# Patient Record
Sex: Male | Born: 1970 | Race: White | Hispanic: No | Marital: Married | Smoking: Never smoker
Health system: Southern US, Community
[De-identification: ages and names within clinical notes are randomized; demographics above are authoritative.]

## PROBLEM LIST (undated history)

## (undated) DIAGNOSIS — K219 Gastro-esophageal reflux disease without esophagitis: Secondary | ICD-10-CM

## (undated) DIAGNOSIS — E119 Type 2 diabetes mellitus without complications: Secondary | ICD-10-CM

## (undated) DIAGNOSIS — I10 Essential (primary) hypertension: Secondary | ICD-10-CM

## (undated) HISTORY — PX: APPENDECTOMY: SHX54

---

## 2002-12-12 ENCOUNTER — Encounter: Payer: Self-pay | Admitting: Emergency Medicine

## 2002-12-12 ENCOUNTER — Inpatient Hospital Stay (HOSPITAL_COMMUNITY): Admission: EM | Admit: 2002-12-12 | Discharge: 2002-12-14 | Payer: Self-pay | Admitting: Emergency Medicine

## 2002-12-12 ENCOUNTER — Encounter (INDEPENDENT_AMBULATORY_CARE_PROVIDER_SITE_OTHER): Payer: Self-pay | Admitting: Specialist

## 2003-07-02 ENCOUNTER — Emergency Department (HOSPITAL_COMMUNITY): Admission: AD | Admit: 2003-07-02 | Discharge: 2003-07-02 | Payer: Self-pay | Admitting: Family Medicine

## 2007-04-19 ENCOUNTER — Emergency Department (HOSPITAL_COMMUNITY): Admission: EM | Admit: 2007-04-19 | Discharge: 2007-04-19 | Payer: Self-pay | Admitting: Emergency Medicine

## 2008-09-20 IMAGING — CR DG KNEE COMPLETE 4+V*L*
4 series · 4 of 4 positions shown · non-contrast
Comparison: None.

Exam: Left knee, 4 views.

HISTORY: Knee pain. Status post injury.

[t knee ap left]
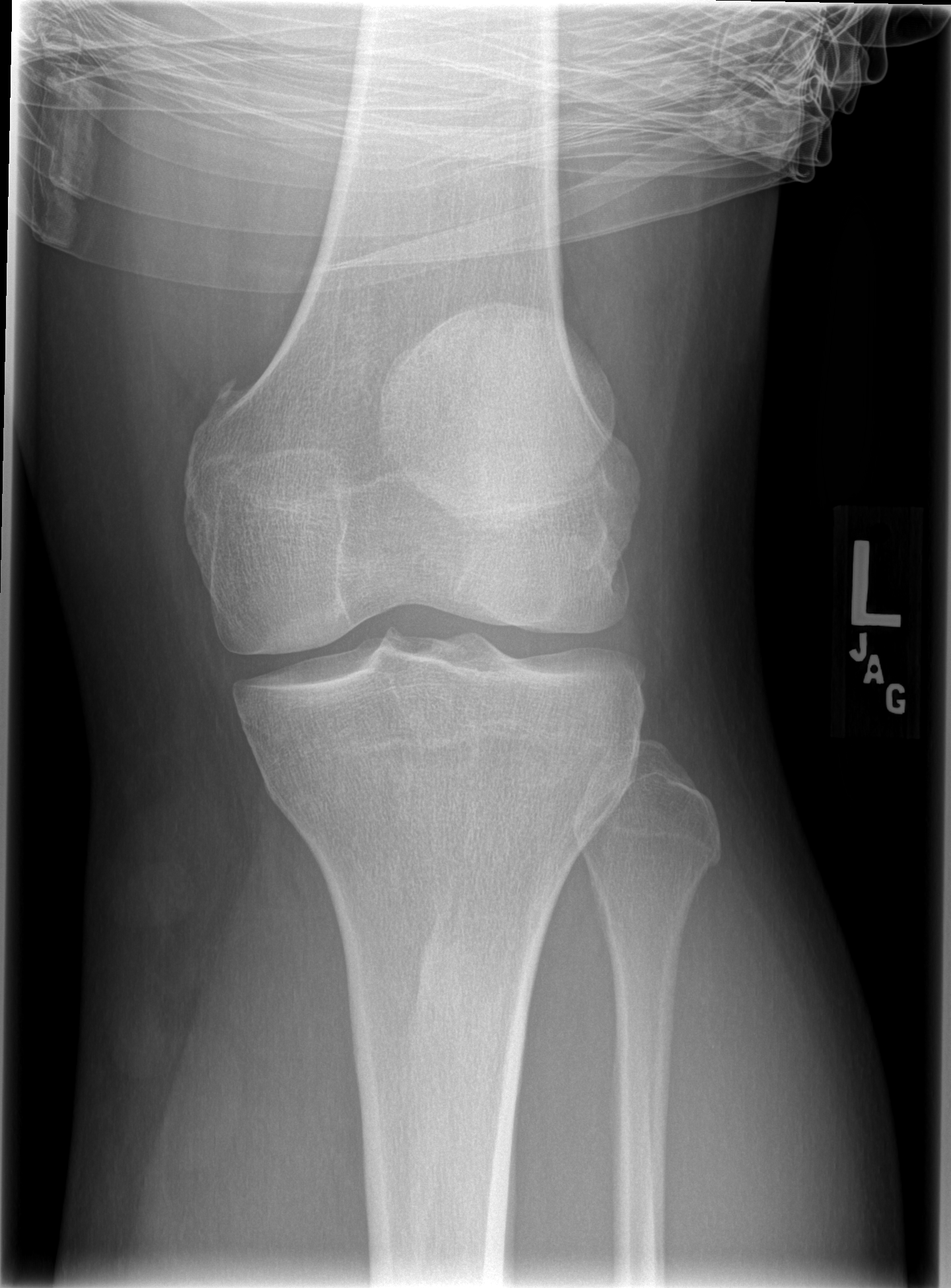

[t knee oblique left (1 of 2)]
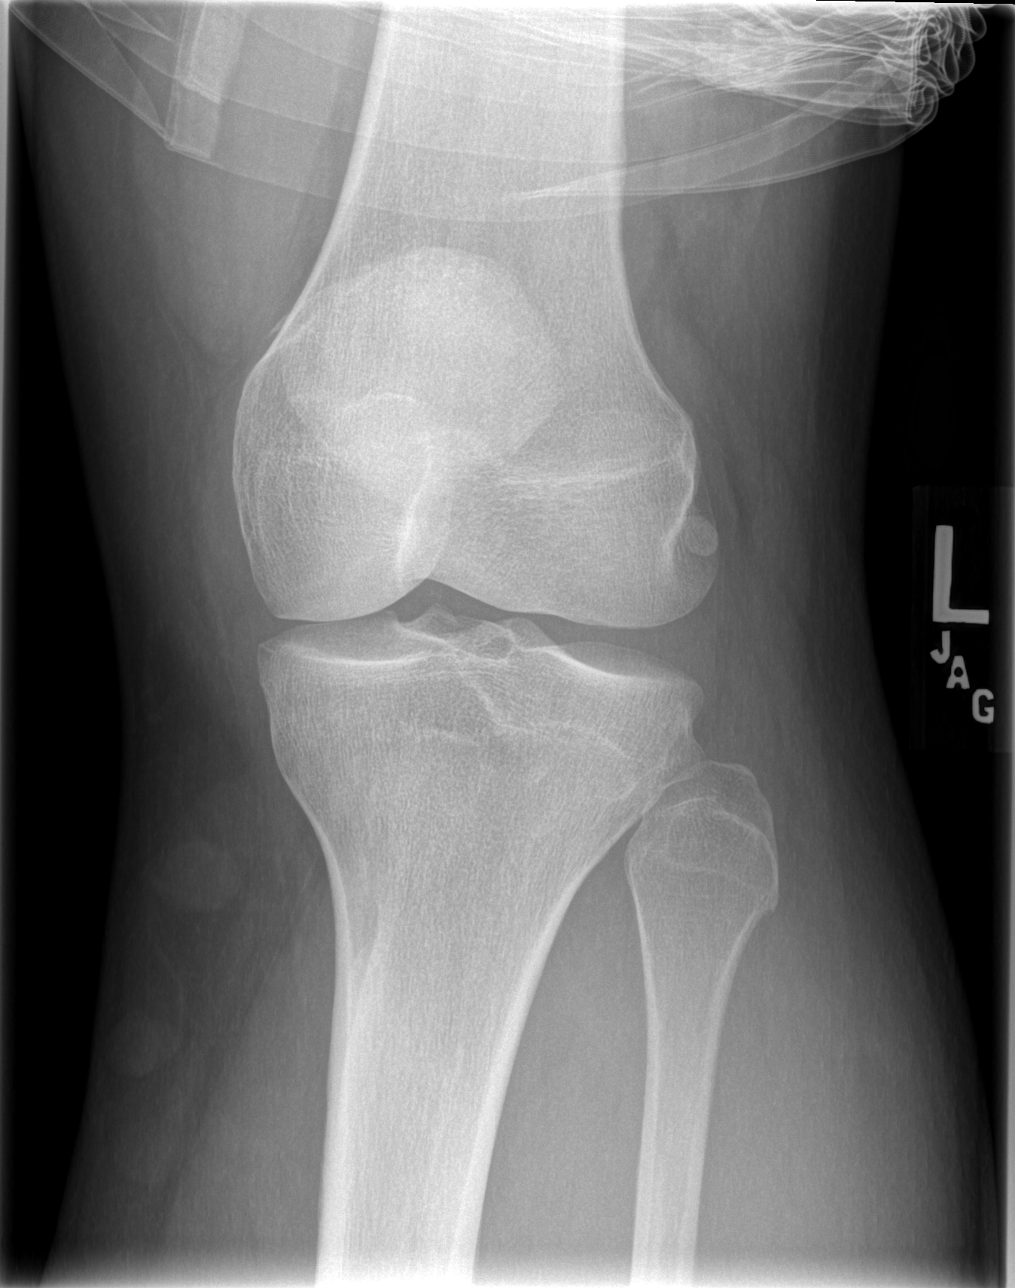

[t knee oblique left (2 of 2)]
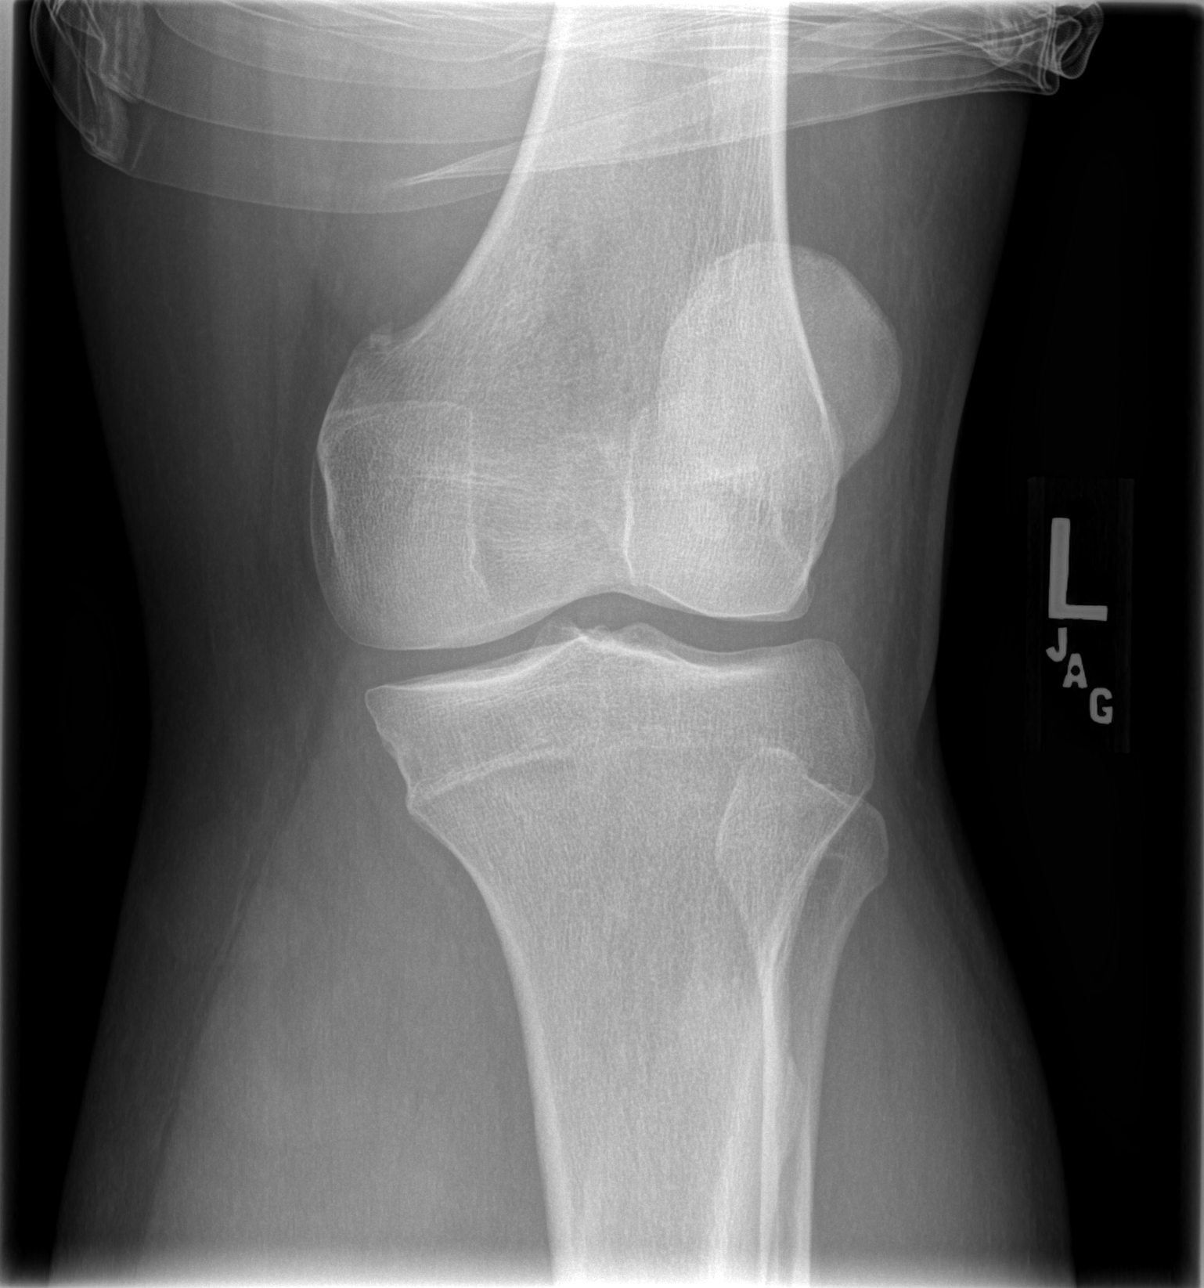

[t knee lat left]
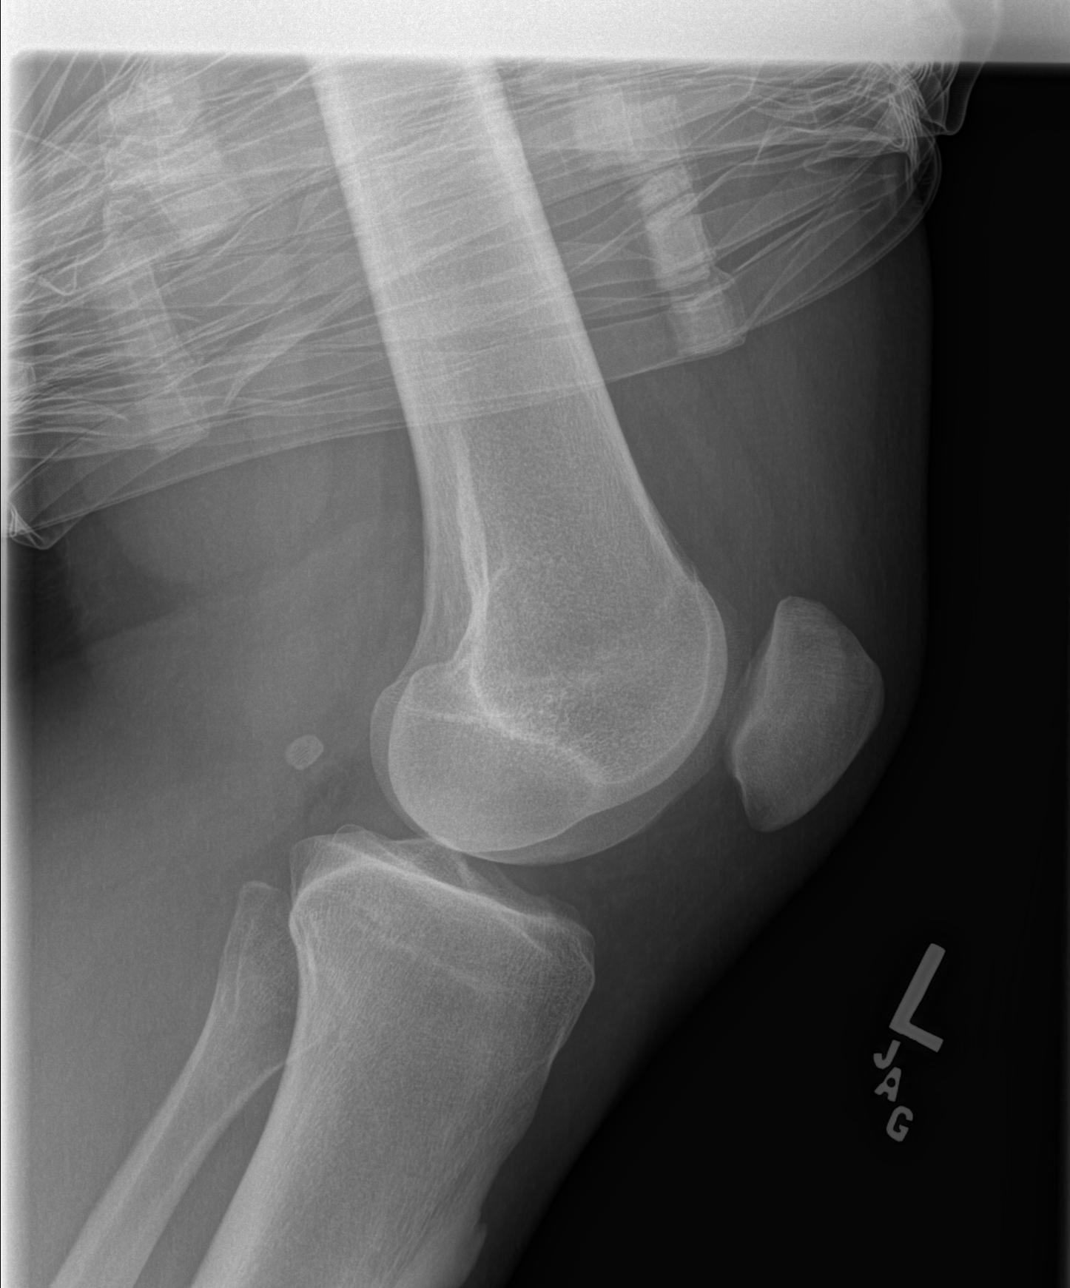

[4 of 4 positions shown; findings below may reference images not displayed]

FINDINGS: There is no joint effusion. 

Quadriceps and patellar tendons are intact.

There is mild degenerative joint disease with osteophyte formation and
sharpening of the tibial spines.
IMPRESSION: 1. Mild degenerative joint disease.
2. No acute osseous abnormalities.

## 2010-12-06 NOTE — Op Note (Signed)
Lawrence Yu, Lawrence Yu NO.:  [id number]   MEDICAL RECORD NO.:  [id number]                   PATIENT TYPE:  INP   LOCATION:  5735                                 FACILITY:  [facility]   PHYSICIAN:  Angelia Mould. Derrell Lolling, M.D.             DATE OF BIRTH:  1971/06/11   DATE OF PROCEDURE:  12/12/2002  DATE OF DISCHARGE:                                 OPERATIVE REPORT   PREOPERATIVE DIAGNOSIS:  Acute appendicitis.   POSTOPERATIVE DIAGNOSIS:  Acute appendicitis.   OPERATION PERFORMED:  Laparoscopic appendectomy.   SURGEON:  Angelia Mould. Derrell Lolling, M.D.   INDICATIONS FOR PROCEDURE:  The patient is a 40 year old white male who  presents to the emergency room with a 20 hour history of right lower  quadrant pain and nausea.  He has not felt that ill but was concerned  because of the persistence of the pain and the persistence of his nausea.  His CBC and urinalysis was normal but on exam, he was found to have some  tenderness in the right lower quadrant lateral to McBurney's point.  CT scan  suggested that he had acute appendicitis.  The appendix was thickened and  there was some inflammatory soft tissue stranding around it and it was noted  to be fairly long and retrocecal going almost all the way up to the liver.  He was brought to the operating room urgently.   OPERATIVE FINDINGS:  The appendix did appear to be inflamed but there was  not any significant exudate.  There was no gangrene. There was no evidence  of perforation.  The liver and gallbladder looked normal.  The right colon  looked normal.  The last three or four feet of terminal ileum looked normal.  There were no other abnormalities.  We felt that appendectomy was indicated.   DESCRIPTION OF PROCEDURE:  Following the induction of general endotracheal  anesthesia, a Foley catheter was inserted.  The patient's abdomen was  prepped and draped in a sterile fashion.  0.5% Marcaine with epinephrine was  used as  a local infiltration anesthetic.  Short transverse incision was made  at the superior rim of the umbilicus.  The fascia was incised to the midline  and the abdominal cavity entered under direct vision.  A 10 mm Hasson trocar  was inserted and secured with a pursestring suture of 0 Vicryl.  The  pneumoperitoneum was created.  The video camera was inserted with  visualization and findings as described above.  We placed a 5 mm trocar in  the right upper quadrant and a 12 mm trocar in the suprapubic area.  We  ultimately had to place a 6 mm trocar in the midline in the infraumbilical  area for retraction.   We surveyed the entire abdomen and found no other abnormality other than a  very long retrocecal appendix.  Using the 30 degree  scope, we were able to  visualize the base of the appendix and to mobilize the proximal appendix  from its lateral peritoneal attachments using the Harmonic scalpel.  We  could not visualize the tip of the appendix at this point and so I created a  window in the mesoappendix and could clearly visualize this window and could  clearly visualize the insertion of the appendix on the cecum.  I stapled  across this using the Endo GIA stapling device which amputated the appendix  from the cecum.  The staple line was quite healthy and secure.  We had a  little bit of bleeding from the mesoappendix which was easily controlled  with the Harmonic scalpel.  We then spent a great deal of time slowly  mobilizing the appendix out of the retroperitoneum,  mobilizing the right  colon medially but ultimately, I was able to take all of the mesoappendix  down and dissect all the way up to the tip of the appendix which was almost  touching the undersurface of the liver.  The appendix was removed in one  piece intact.  It was placed in a specimen bag and removed.   I then irrigated the right pericolic gutter and the subphrenic space with  saline until the irrigation fluid was clear.  I  checked for bleeding and  there was no bleeding anywhere.  I took a careful look at the right colon  and the small bowel, found no evidence of injury or any other disease  process.  The trocars were removed under direct vision.  There was no  bleeding from the trocar sites.  The pneumoperitoneum was released.  The  fascia at the umbilicus and the fascia at the suprapubic trocar site were  closed with 0 Vicryl sutures.  The incisions were irrigated with saline and  skin closed with subcuticular sutures of 4-0 Vicryl and Steri-Strips.  Clean  bandages were placed.  The patient was taken to the recovery room in stable  condition.  The estimated blood loss was about 20mL. Complications were  none.  Sponge, needle and instrument counts were correct.                                                Angelia Mould. Derrell Lolling, M.D.    HMI/MEDQ  D:  12/12/2002  T:  12/13/2002  Job:  [id number]   cc:   L. Lupe Carney, M.D.  [address] [place]  [place] [address]  Fax: [phone]

## 2010-12-06 NOTE — H&P (Signed)
NAMEPARRIS, CUDWORTH NO.:  [id number]   MEDICAL RECORD NO.:  [id number]                   PATIENT TYPE:  INP   LOCATION:  1824                                 FACILITY:  [facility]   PHYSICIAN:  Angelia Mould. Derrell Lolling, M.D.             DATE OF BIRTH:  12-Aug-1970   DATE OF ADMISSION:  12/12/2002  DATE OF DISCHARGE:                                HISTORY & PHYSICAL   CHIEF COMPLAINT:  Right lower quadrant pain and nausea.   HISTORY OF PRESENT ILLNESS:  This is a 40 year old white male in previously  good health.  At 9:30 p.m. last night approximately 24 hours ago he  developed fairly sudden onset of right lower quadrant pain.  It hurt to move  or cough, but he was able to sleep some.  He saw Dr. Lupe Carney this  morning, an urinalysis was normal, and he was thought not to have any acute  surgical problem.  He ate lunch today, then became nauseated, and was  nauseated all afternoon.  He did not vomit.  He was concerned and came to  the emergency room.   He states that he had diarrhea about 1-1/2 weeks ago for several days, but  that has resolved, and he had a normal bowel movement yesterday.  He has not  had a bowel movement today.  He is voiding without any symptoms.  He denies  fevers, chills, or sweats.  He has not had any prior episodes of this  problem.   He had a CT scan in the emergency room tonight and that shows a dilated  appendix at 14 mm in diameter in the retrocecal location, and there is some  definite soft tissue stranding noted.  I was asked to see him because of his  symptoms and abnormal CT scan.   PAST MEDICAL HISTORY:  1. He has passed a kidney stone on the right in the past.  2. Pilonidal abscess drained.  3. Gastroesophageal reflux disease, and he has had esophageal dilatation at     [facility].  4. Elevated triglycerides.   CURRENT MEDICATIONS:  1. Tri-Chlor one tablet daily.  2. Nexium one tablet daily.   ALLERGIES:   No known drug allergies.   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  He is  married, has three children.  Denies the use of tobacco or alcohol.   FAMILY HISTORY:  Mother living and well.  Father living and has had a  myocardial infarction, has diabetes.  One brother living and well.   REVIEW OF SYMPTOMS:  All systems reviewed and are noncontributory except as  described above.   PHYSICAL EXAMINATION:  GENERAL:  A very pleasant, overweight, white male in  minimal distress.  VITAL SIGNS:  Temperature 98.8, pulse 100, respirations 20, blood pressure  121/80.  HEENT:  Eyes:  Sclerae clear.  Extraocular movements were intact.  ENT:  Nose, lips, tongue, and oropharynx without gross lesions.  NECK:  Supple, nontender, no thyroid mass.  No adenopathy.  No jugular  venous distention.  LUNGS:  Clear to auscultation.  No CVA tenderness.  HEART:  Regular rate and rhythm, no murmur.  Heart sounds are in the left  chest.  Radial, femoral, and posterior tibial pulses are palpable, no  peripheral edema.  ABDOMEN:  Somewhat obese.  Soft.  Not distended.  Hypoactive bowel sounds.  He is tender in the right lower quadrant just lateral to McBurney's point,  and this is a very localized reproducible finding.  He is not particularly  tender anywhere else.  There is no mass.  There is no hernia.  Liver and  spleen do not appear enlarged.  He really just has tenderness and minimal  guarding.  There is no rebound tenderness at all.  GENITOURINARY:  Penis, scrotum, and testes are normal.  There is no  tenderness or mass.  EXTREMITIES:  All four extremities move well.  There is no deformity or  pain.  NEUROLOGIC:  No gross motor or sensory deficits.   LABORATORY DATA:  CT scan suggesting early appendicitis as described above.  Hemoglobin 14.9, white blood cell count 6300.  Urinalysis is normal.   ASSESSMENT:  1. Right lower quadrant pain.  Early acute appendicitis is suspected  on the     basis of the history and the CT scan findings, although his physical     examination is somewhat atypical and his history is slightly atypical as     well.  Nevertheless, this seems to be the most likely diagnosis.  2. History of kidney stones with negative CT scan for kidney stones.  3. History of gastroesophageal reflux disease.  4. History of elevated triglycerides.   PLAN:  1. The patient will be taken to the operating room for diagnostic     laparoscopy and laparoscopic appendectomy.  2. I have explained to the patient and his wife the differential diagnoses,     including appendicitis, normal appendix, Meckel's diverticulum, Crohn's     disease, viral syndrome, and other unforeseen diagnoses.  I have     explained to them the rationale for the diagnostic laparoscopy and the     uncertainty of the diagnosis.  I have also explained to them the natural     history of appendicitis with rupture and abscess, and that the CT scan     finding seem valid enough to warrant surgery.   I have discussed the indications and details of surgery with them.  Both the  risks and complications have been outlined, including, but not limited to,  bleeding, infection, conversion to open laparotomy, injury to adjacent  organs, wound problems such as hernia or infection, cardiac, pulmonary, and  thromboembolic problems, and other unforeseen problems.  They seem to  understand these issues well.  At this time, all of their questions are  answered.  They are willing to have this laparoscopy done.                                                Angelia Mould. Derrell Lolling, M.D.    HMI/MEDQ  D:  12/12/2002  T:  12/12/2002  Job:  [id number]   cc:   L. Lupe Carney, M.D.  [address] [place]  [place] [address]  Fax: [phone]

## 2016-05-03 ENCOUNTER — Emergency Department (HOSPITAL_BASED_OUTPATIENT_CLINIC_OR_DEPARTMENT_OTHER)
Admission: EM | Admit: 2016-05-03 | Discharge: 2016-05-03 | Disposition: A | Discharge status: Home / Self Care | Payer: Worker's Compensation | Attending: Emergency Medicine | Admitting: Emergency Medicine

## 2016-05-03 ENCOUNTER — Encounter (HOSPITAL_BASED_OUTPATIENT_CLINIC_OR_DEPARTMENT_OTHER): Payer: Self-pay | Admitting: Emergency Medicine

## 2016-05-03 DIAGNOSIS — Z7984 Long term (current) use of oral hypoglycemic drugs: Secondary | ICD-10-CM | POA: Diagnosis not present

## 2016-05-03 DIAGNOSIS — Z7721 Contact with and (suspected) exposure to potentially hazardous body fluids: Secondary | ICD-10-CM | POA: Diagnosis present

## 2016-05-03 DIAGNOSIS — Z578 Occupational exposure to other risk factors: Secondary | ICD-10-CM | POA: Insufficient documentation

## 2016-05-03 DIAGNOSIS — Z79899 Other long term (current) drug therapy: Secondary | ICD-10-CM | POA: Insufficient documentation

## 2016-05-03 DIAGNOSIS — I10 Essential (primary) hypertension: Secondary | ICD-10-CM | POA: Insufficient documentation

## 2016-05-03 DIAGNOSIS — Z579 Occupational exposure to unspecified risk factor: Secondary | ICD-10-CM

## 2016-05-03 HISTORY — DX: Essential (primary) hypertension: I10
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
## 2016-05-03 NOTE — ED Notes (Signed)
PA at bedside.

## 2016-08-04 ENCOUNTER — Emergency Department (HOSPITAL_BASED_OUTPATIENT_CLINIC_OR_DEPARTMENT_OTHER)
Admission: EM | Admit: 2016-08-04 | Discharge: 2016-08-04 | Disposition: A | Discharge status: Home / Self Care | Payer: Worker's Compensation | Attending: Emergency Medicine | Admitting: Emergency Medicine

## 2016-08-04 ENCOUNTER — Encounter (HOSPITAL_BASED_OUTPATIENT_CLINIC_OR_DEPARTMENT_OTHER): Payer: Self-pay | Admitting: Emergency Medicine

## 2016-08-04 ENCOUNTER — Emergency Department (HOSPITAL_BASED_OUTPATIENT_CLINIC_OR_DEPARTMENT_OTHER): Payer: Worker's Compensation

## 2016-08-04 DIAGNOSIS — Z79899 Other long term (current) drug therapy: Secondary | ICD-10-CM | POA: Insufficient documentation

## 2016-08-04 DIAGNOSIS — Y9389 Activity, other specified: Secondary | ICD-10-CM | POA: Insufficient documentation

## 2016-08-04 DIAGNOSIS — Z7984 Long term (current) use of oral hypoglycemic drugs: Secondary | ICD-10-CM | POA: Diagnosis not present

## 2016-08-04 DIAGNOSIS — Y929 Unspecified place or not applicable: Secondary | ICD-10-CM | POA: Diagnosis not present

## 2016-08-04 DIAGNOSIS — W1839XA Other fall on same level, initial encounter: Secondary | ICD-10-CM | POA: Diagnosis not present

## 2016-08-04 DIAGNOSIS — S8992XA Unspecified injury of left lower leg, initial encounter: Secondary | ICD-10-CM

## 2016-08-04 DIAGNOSIS — Y990 Civilian activity done for income or pay: Secondary | ICD-10-CM | POA: Diagnosis not present

## 2016-08-04 DIAGNOSIS — I10 Essential (primary) hypertension: Secondary | ICD-10-CM | POA: Insufficient documentation

## 2016-08-04 DIAGNOSIS — E119 Type 2 diabetes mellitus without complications: Secondary | ICD-10-CM | POA: Diagnosis not present

## 2016-08-04 HISTORY — DX: Type 2 diabetes mellitus without complications: E11.9

## 2016-08-04 HISTORY — DX: Gastro-esophageal reflux disease without esophagitis: K21.9
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
## 2016-08-04 NOTE — ED Notes (Signed)
Patient returned from XR. 

## 2016-08-04 NOTE — ED Provider Notes (Signed)
[place] [facility] Provider Note   CSN: [id number] Arrival date & time: 08/04/16  1806     History   Chief Complaint Chief Complaint  Patient presents with  . Knee Pain    left    HPI Lawrence Yu is a 46 y.o. male.  The history is provided by the patient.  Knee Pain   This is a new problem. The current episode started 1 to 2 hours ago. The problem occurs constantly. The pain is present in the left knee. The quality of the pain is described as aching. The pain is moderate. Pertinent negatives include no numbness, full range of motion, no stiffness and no tingling. Treatments tried: ice. The treatment provided moderate relief. There has been a history of trauma (mechanical fall with twisting of the knee; her popping int the knee while going down.).    Past Medical History:  Diagnosis Date  . Diabetes mellitus without complication ([place])    pre diabetic  . GERD (gastroesophageal reflux disease)   . Hypertension     There are no active problems to display for this patient.   Past Surgical History:  Procedure Laterality Date  . APPENDECTOMY         Home Medications    Prior to Admission medications   Medication Sig Start Date End Date Taking? Authorizing Provider  metFORMIN (GLUCOPHAGE) 850 MG tablet Take 1,000 mg by mouth 2 (two) times daily with a meal.    Yes Historical Provider, MD  omeprazole (PRILOSEC) 10 MG capsule Take 20 mg by mouth daily.    Yes Historical Provider, MD  LISINOPRIL PO Take by mouth.    Historical Provider, MD    Family History History reviewed. No pertinent family history.  Social History Social History  Substance Use Topics  . Smoking status: Never Smoker  . Smokeless tobacco: Never Used  . Alcohol use No     Allergies   Patient has no known allergies.   Review of Systems Review of Systems  Constitutional: Negative for chills and fever.  HENT: Negative for ear pain and sore throat.   Eyes: Negative for pain and  visual disturbance.  Respiratory: Negative for cough and shortness of breath.   Cardiovascular: Negative for chest pain and palpitations.  Gastrointestinal: Negative for abdominal pain and vomiting.  Genitourinary: Negative for dysuria and hematuria.  Musculoskeletal: Negative for arthralgias, back pain and stiffness.  Skin: Negative for color change and rash.  Neurological: Negative for tingling, seizures, syncope and numbness.  All other systems reviewed and are negative.    Physical Exam Updated Vital Signs BP 138/76 (BP Location: Right Arm)   Pulse 96   Temp 98.1 F (36.7 C) (Oral)   Resp 18   Ht 5\' 11"  (1.803 m)   Wt 280 lb (127 kg)   SpO2 98%   BMI 39.05 kg/m   Physical Exam  Constitutional: He is oriented to person, place, and time. He appears well-developed and well-nourished. No distress.  HENT:  Head: Normocephalic and atraumatic.  Right Ear: External ear normal.  Left Ear: External ear normal.  Nose: Nose normal.  Mouth/Throat: Mucous membranes are normal. No trismus in the jaw.  Eyes: Conjunctivae and EOM are normal. No scleral icterus.  Neck: Normal range of motion and phonation normal.  Cardiovascular: Normal rate and regular rhythm.   Pulmonary/Chest: Effort normal. No stridor. No respiratory distress.  Abdominal: He exhibits no distension.  Musculoskeletal: Normal range of motion. He exhibits no edema.  Left knee: He exhibits normal range of motion, no swelling, no effusion, normal patellar mobility and no bony tenderness. Tenderness found. Medial joint line and MCL tenderness noted. No lateral joint line, no LCL and no patellar tendon tenderness noted.  Neurological: He is alert and oriented to person, place, and time.  Skin: He is not diaphoretic.  Psychiatric: He has a normal mood and affect. His behavior is normal.  Vitals reviewed.    ED Treatments / Results  Labs (all labs ordered are listed, but only abnormal results are displayed) Labs  Reviewed - No data to display  EKG  EKG Interpretation None       Radiology Dg Knee Complete 4 Views Left  Result Date: 08/04/2016 CLINICAL DATA:  Pain and swelling along the medial left knee. EXAM: LEFT KNEE - COMPLETE 4+ VIEW COMPARISON:  Two MRI from 10/15/2007, 04/19/2007 radiographs of the left knee. 2 G FINDINGS: Tubular soft tissue densities are again seen along the medial aspect of the calf consistent with varicose veins. Joint spaces are maintained. No acute fracture or malalignment. Ossific density adjacent to the supracondylar aspect of the medial femoral condyle was present in 2008 and may reflect an old remote MCL injury. IMPRESSION: No acute osseous abnormality noted radiographically. 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 Soft tissue ossification along the course of the proximal MCL may reflect an old remote injury is again noted. Electronically Signed   By: Ashley Royalty M.D.   On: 08/04/2016 18:48    Procedures Procedures (including critical care time)  Medications Ordered in ED Medications - No data to display   Initial Impression / Assessment and Plan / ED Course  I have reviewed the triage vital signs and the nursing notes.  Pertinent labs & imaging results that were available during my care of the patient were reviewed by me and considered in my medical decision making (see chart for details).  Clinical Course     Likely MCL vs meniscal tear. No acute bony injuries on plain film. Ortho follow up. Knee immobilizer provider.  The patient is safe for discharge with strict return precautions.    Final Clinical Impressions(s) / ED Diagnoses   Final diagnoses:  Injury of left knee, initial encounter   Disposition: Discharge  Condition: Good  I have discussed the results, Dx and Tx plan with the patient who expressed understanding and agree(s) with the plan. Discharge instructions discussed at great length. The patient was given strict return precautions who  verbalized understanding of the instructions. No further questions at time of discharge.    New Prescriptions   No medications on file    Follow Up: Orthopedic surgery  Schedule an appointment as soon as possible for a visit in 2 weeks If symptoms do not improve or  worsen      Fatima Blank, MD 08/04/16 [id number]

## 2016-08-04 NOTE — ED Notes (Signed)
Patient transported to X-ray 

## 2016-08-04 NOTE — ED Notes (Signed)
Signature pad not working for pt to sign out.  Pt verbalized understanding.

## 2017-05-06 DIAGNOSIS — E1169 Type 2 diabetes mellitus with other specified complication: Secondary | ICD-10-CM | POA: Insufficient documentation

## 2017-05-06 DIAGNOSIS — E782 Mixed hyperlipidemia: Secondary | ICD-10-CM

## 2017-05-06 DIAGNOSIS — E1129 Type 2 diabetes mellitus with other diabetic kidney complication: Secondary | ICD-10-CM | POA: Insufficient documentation

## 2017-05-06 DIAGNOSIS — E1165 Type 2 diabetes mellitus with hyperglycemia: Secondary | ICD-10-CM | POA: Insufficient documentation

## 2017-05-26 DIAGNOSIS — G4733 Obstructive sleep apnea (adult) (pediatric): Secondary | ICD-10-CM | POA: Insufficient documentation

## 2017-05-26 DIAGNOSIS — E669 Obesity, unspecified: Secondary | ICD-10-CM | POA: Insufficient documentation

## 2017-05-26 DIAGNOSIS — G4730 Sleep apnea, unspecified: Secondary | ICD-10-CM | POA: Insufficient documentation

## 2017-05-26 DIAGNOSIS — I10 Essential (primary) hypertension: Secondary | ICD-10-CM | POA: Insufficient documentation

## 2017-05-26 DIAGNOSIS — Z8249 Family history of ischemic heart disease and other diseases of the circulatory system: Secondary | ICD-10-CM | POA: Insufficient documentation

## 2017-09-03 DIAGNOSIS — E781 Pure hyperglyceridemia: Secondary | ICD-10-CM | POA: Insufficient documentation

## 2017-09-08 ENCOUNTER — Ambulatory Visit: Payer: Self-pay | Admitting: Nurse Practitioner

## 2017-10-08 ENCOUNTER — Other Ambulatory Visit: Payer: Self-pay

## 2017-10-08 MED ORDER — OMEPRAZOLE 10 MG PO CPDR: 20.0000 mg | DELAYED_RELEASE_CAPSULE | Freq: Every day | ORAL | 5 refills | Status: DC

## 2017-10-27 ENCOUNTER — Other Ambulatory Visit: Payer: Self-pay

## 2017-10-27 MED ORDER — OMEPRAZOLE 20 MG PO CPDR: 20.0000 mg | DELAYED_RELEASE_CAPSULE | Freq: Every day | ORAL | 5 refills | Status: DC

## 2017-11-11 ENCOUNTER — Other Ambulatory Visit: Payer: Self-pay | Admitting: Nurse Practitioner

## 2017-11-11 MED ORDER — LISINOPRIL 5 MG PO TABS: 5.0000 mg | ORAL_TABLET | Freq: Every day | ORAL | 2 refills | Status: DC

## 2017-12-16 DIAGNOSIS — E66812 Obesity, class 2: Secondary | ICD-10-CM | POA: Insufficient documentation

## 2018-06-14 ENCOUNTER — Other Ambulatory Visit: Payer: Self-pay

## 2018-06-14 MED ORDER — OMEPRAZOLE 20 MG PO CPDR: 20.0000 mg | DELAYED_RELEASE_CAPSULE | Freq: Every day | ORAL | 5 refills | Status: DC

## 2018-06-21 ENCOUNTER — Other Ambulatory Visit: Payer: Self-pay

## 2018-06-21 ENCOUNTER — Telehealth: Payer: Self-pay

## 2018-06-21 NOTE — Telephone Encounter (Signed)
[facility] PT NEED APPT FOR REFILLS LAST HERE 10/18

## 2018-08-31 ENCOUNTER — Ambulatory Visit: Payer: Self-pay | Admitting: Adult Health

## 2018-09-02 ENCOUNTER — Encounter: Payer: Self-pay | Admitting: Adult Health

## 2018-09-02 ENCOUNTER — Ambulatory Visit: Payer: PRIVATE HEALTH INSURANCE | Admitting: Adult Health

## 2018-09-02 VITALS — BP 140/82 | HR 107 | Resp 16 | Ht 71.0 in | Wt 278.0 lb

## 2018-09-02 DIAGNOSIS — G4733 Obstructive sleep apnea (adult) (pediatric): Secondary | ICD-10-CM | POA: Diagnosis not present

## 2018-09-02 DIAGNOSIS — N529 Male erectile dysfunction, unspecified: Secondary | ICD-10-CM

## 2018-09-02 DIAGNOSIS — E1169 Type 2 diabetes mellitus with other specified complication: Secondary | ICD-10-CM | POA: Diagnosis not present

## 2018-09-02 DIAGNOSIS — Z9989 Dependence on other enabling machines and devices: Secondary | ICD-10-CM

## 2018-09-02 DIAGNOSIS — E1165 Type 2 diabetes mellitus with hyperglycemia: Secondary | ICD-10-CM

## 2018-09-02 DIAGNOSIS — I10 Essential (primary) hypertension: Secondary | ICD-10-CM

## 2018-09-02 DIAGNOSIS — E782 Mixed hyperlipidemia: Secondary | ICD-10-CM

## 2018-09-02 LAB — POCT GLYCOSYLATED HEMOGLOBIN (HGB A1C): Hemoglobin A1C: 7.3 % — AB (ref 4.0–5.6)

## 2018-09-02 MED ORDER — OMEPRAZOLE 20 MG PO CPDR: 20.0000 mg | DELAYED_RELEASE_CAPSULE | Freq: Every day | ORAL | 5 refills | Status: DC

## 2018-09-02 MED ORDER — LISINOPRIL 5 MG PO TABS: 5.0000 mg | ORAL_TABLET | Freq: Every day | ORAL | 2 refills | Status: DC

## 2018-09-02 MED ORDER — SILDENAFIL CITRATE 20 MG PO TABS
20.0000 mg | ORAL_TABLET | Freq: Every day | ORAL | 3 refills | Status: DC | PRN
Start: 2018-09-02 — End: 2021-06-27

## 2018-09-02 NOTE — Progress Notes (Addendum)
[facility], [place] [address]  Internal MEDICINE  Office Visit Note  Patient Name: Lawrence Yu  [id number]  [id number]  Date of Service: 09/02/2018  No chief complaint on file.   HPI  Pt here for follow up on DM, HTN and ED.  Pt denies any current issues.  His blood pressures currently well controlled on lisinopril.  His A1c today is 7.3.  Encourage patient to continue modifying his diet and taking medications as prescribed.  Denies any neuropathy or other pains at this time.  Patient reports his erectile dysfunction remains unchanged.  He gets good results taking 40 mg of sildenafil for sexual activity.  He is requesting some med refills which will be provided at this visit.   Current Medication: Outpatient Encounter Medications as of 09/02/2018  Medication Sig  . Canagliflozin-metFORMIN HCl (INVOKAMET) 50-1000 MG TABS Take by mouth 2 (two) times daily.  . Choline Fenofibrate (FENOFIBRIC ACID) 135 MG CPDR Take by mouth.  Marland Kitchen glucose blood (ONETOUCH VERIO) test strip Use to check blood sugar 2 time(s) daily..  . Icosapent Ethyl (VASCEPA) 1 g CAPS Take by mouth daily.  Marland Kitchen LIFESCAN FINEPOINT LANCETS MISC Use to check blood sugar 2 time(s) daily.  Marland Kitchen lisinopril (PRINIVIL,ZESTRIL) 5 MG tablet Take 1 tablet (5 mg total) by mouth daily.  Marland Kitchen omeprazole (PRILOSEC) 20 MG capsule Take 1 capsule (20 mg total) by mouth daily.  . sildenafil (REVATIO) 20 MG tablet Take 1 tablet (20 mg total) by mouth daily as needed. Take 2 tab daily as needed  . [DISCONTINUED] lisinopril (PRINIVIL,ZESTRIL) 5 MG tablet Take 1 tablet (5 mg total) by mouth daily.  . [DISCONTINUED] LISINOPRIL PO Take by mouth.  . [DISCONTINUED] omeprazole (PRILOSEC) 20 MG capsule Take 1 capsule (20 mg total) by mouth daily.  . [DISCONTINUED] sildenafil (REVATIO) 20 MG tablet Take 20 mg by mouth. Take 2 tab daily as needed  . [DISCONTINUED] metFORMIN (GLUCOPHAGE) 850 MG tablet Take 1,000 mg by mouth 2  (two) times daily with a meal.    No facility-administered encounter medications on file as of 09/02/2018.     Surgical History: Past Surgical History:  Procedure Laterality Date  . APPENDECTOMY      Medical History: Past Medical History:  Diagnosis Date  . Diabetes mellitus without complication ([place])    pre diabetic  . GERD (gastroesophageal reflux disease)   . Hypertension     Family History: History reviewed. No pertinent family history.  Social History   Socioeconomic History  . Marital status: Married    Spouse name: Not on file  . Number of children: Not on file  . Years of education: Not on file  . Highest education level: Not on file  Occupational History  . Not on file  Social Needs  . Financial resource strain: Not on file  . Food insecurity:    Worry: Not on file    Inability: Not on file  . Transportation needs:    Medical: Not on file    Non-medical: Not on file  Tobacco Use  . Smoking status: Never Smoker  . Smokeless tobacco: Never Used  Substance and Sexual Activity  . Alcohol use: No  . Drug use: No  . Sexual activity: Not on file  Lifestyle  . Physical activity:    Days per week: Not on file    Minutes per session: Not on file  . Stress: Not on file  Relationships  . Social connections:  Talks on phone: Not on file    Gets together: Not on file    Attends religious service: Not on file    Active member of club or organization: Not on file    Attends meetings of clubs or organizations: Not on file    Relationship status: Not on file  . Intimate partner violence:    Fear of current or ex partner: Not on file    Emotionally abused: Not on file    Physically abused: Not on file    Forced sexual activity: Not on file  Other Topics Concern  . Not on file  Social History Narrative  . Not on file      Review of Systems  Constitutional: Negative.  Negative for chills, fatigue and unexpected weight change.  HENT: Negative.  Negative  for congestion, rhinorrhea, sneezing and sore throat.   Eyes: Negative for redness.  Respiratory: Negative.  Negative for cough, chest tightness and shortness of breath.   Cardiovascular: Negative.  Negative for chest pain and palpitations.  Gastrointestinal: Negative.  Negative for abdominal pain, constipation, diarrhea, nausea and vomiting.  Endocrine: Negative.   Genitourinary: Negative.  Negative for dysuria and frequency.  Musculoskeletal: Negative.  Negative for arthralgias, back pain, joint swelling and neck pain.  Skin: Negative.  Negative for rash.  Allergic/Immunologic: Negative.   Neurological: Negative.  Negative for tremors and numbness.  Hematological: Negative for adenopathy. Does not bruise/bleed easily.  Psychiatric/Behavioral: Negative.  Negative for behavioral problems, sleep disturbance and suicidal ideas. The patient is not nervous/anxious.     Vital Signs: BP 140/82   Pulse (!) 107   Resp 16   Ht 5\' 11"  (1.803 m)   Wt 278 lb (126.1 kg)   SpO2 98%   BMI 38.77 kg/m    Physical Exam Vitals signs and nursing note reviewed.  Constitutional:      General: He is not in acute distress.    Appearance: He is well-developed. He is not diaphoretic.  HENT:     Head: Normocephalic and atraumatic.     Mouth/Throat:     Pharynx: No oropharyngeal exudate.  Eyes:     Pupils: Pupils are equal, round, and reactive to light.  Neck:     Musculoskeletal: Normal range of motion and neck supple.     Thyroid: No thyromegaly.     Vascular: No JVD.     Trachea: No tracheal deviation.  Cardiovascular:     Rate and Rhythm: Normal rate and regular rhythm.     Heart sounds: Normal heart sounds. No murmur. No friction rub. No gallop.   Pulmonary:     Effort: Pulmonary effort is normal. No respiratory distress.     Breath sounds: Normal breath sounds. No wheezing or rales.  Chest:     Chest wall: No tenderness.  Abdominal:     Palpations: Abdomen is soft.     Tenderness: There  is no abdominal tenderness. There is no guarding.  Musculoskeletal: Normal range of motion.  Lymphadenopathy:     Cervical: No cervical adenopathy.  Skin:    General: Skin is warm and dry.  Neurological:     Mental Status: He is alert and oriented to person, place, and time.     Cranial Nerves: No cranial nerve deficit.  Psychiatric:        Behavior: Behavior normal.        Thought Content: Thought content normal.        Judgment: Judgment normal.  Assessment/Plan: 1. Uncontrolled type 2 diabetes mellitus with hyperglycemia (HCC) A1c 7.3.  No medication change at this time but encouraged patient to continue taking medications and follow-up periodically for A1c recheck. - POCT HgB A1C  2. Erectile dysfunction, unspecified erectile dysfunction type Refill patient's prescription for sildenafil.  Using good Rx card and sent to [facility] pharmacy.  Instructed patient to continue taking medication as before and to alert [place] of any changes. - sildenafil (REVATIO) 20 MG tablet; Take 1 tablet (20 mg total) by mouth daily as needed. Take 2 tab daily as needed  Dispense: 30 tablet; Refill: 3  3. OSA on CPAP Patient reports his CPAP machine has broken after many years and he needs a new CPAP sheen.  Home sleep study ordered at this time with a follow-up after home sleep study. - Home sleep test  4. 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  5. Hypertension, unspecified type Patient's blood pressure stable today 140/82.  He continues to take his medications as prescribed.  It is of note that the patient was tachycardic initially at 107 bpm.  Upon re-checking in the exam room found patient's pulse to be 88.  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  We will continue to follow patient's pulse and blood pressure at future visits.  No dizziness, chest pain, shortness breath or other issues.  General Counseling: Lawrence Yu verbalizes understanding of the findings of todays visit and agrees with plan of treatment. I have discussed any further diagnostic evaluation that may be needed or ordered today. We also reviewed his medications today. he has been encouraged to call the office with any questions or concerns that should arise related to todays visit.    Orders Placed This Encounter  Procedures  . POCT HgB A1C  . Home sleep test    Meds ordered this encounter  Medications  . omeprazole (PRILOSEC) 20 MG capsule    Sig: Take 1 capsule (20 mg total) by mouth daily.    Dispense:  30 capsule    Refill:  5  . lisinopril (PRINIVIL,ZESTRIL) 5 MG tablet    Sig: Take 1 tablet (5 mg total) by mouth daily.    Dispense:  90 tablet    Refill:  2  . sildenafil (REVATIO) 20 MG tablet    Sig: Take 1 tablet (20 mg total) by mouth daily as needed. Take 2 tab daily as needed    Dispense:  30 tablet    Refill:  3    Time spent: 25 Minutes   This patient was seen by Orson Gear AGNP-C in Collaboration with Dr Lavera Guise as a part of collaborative care agreement     Kendell Bane AGNP-C Internal medicine

## 2018-09-02 NOTE — Patient Instructions (Signed)
Diabetes Mellitus and Nutrition, Adult  When you have diabetes (diabetes mellitus), it is very important to have healthy eating habits because your blood sugar (glucose) levels are greatly affected by what you eat and drink. Eating healthy foods in the appropriate amounts, at about the same times every day, can help you:  · Control your blood glucose.  · Lower your risk of heart disease.  · Improve your blood pressure.  · Reach or maintain a healthy weight.  Every person with diabetes is different, and each person has different needs for a meal plan. Your health care provider may recommend that you work with a diet and nutrition specialist (dietitian) to make a meal plan that is best for you. Your meal plan may vary depending on factors such as:  · The calories you need.  · The medicines you take.  · Your weight.  · Your blood glucose, blood pressure, and cholesterol levels.  · Your activity level.  · Other health conditions you have, such as heart or kidney disease.  How do carbohydrates affect me?  Carbohydrates, also called carbs, affect your blood glucose level more than any other type of food. Eating carbs naturally raises the amount of glucose in your blood. Carb counting is a method for keeping track of how many carbs you eat. Counting carbs is important to keep your blood glucose at a healthy level, especially if you use insulin or take certain oral diabetes medicines.  It is important to know how many carbs you can safely have in each meal. This is different for every person. Your dietitian can help you calculate how many carbs you should have at each meal and for each snack.  Foods that contain carbs include:  · Bread, cereal, rice, pasta, and crackers.  · Potatoes and corn.  · Peas, beans, and lentils.  · Milk and yogurt.  · Fruit and juice.  · Desserts, such as cakes, cookies, ice cream, and candy.  How does alcohol affect me?  Alcohol can cause a sudden decrease in blood glucose (hypoglycemia),  especially if you use insulin or take certain oral diabetes medicines. Hypoglycemia can be a life-threatening condition. Symptoms of hypoglycemia (sleepiness, dizziness, and confusion) are similar to symptoms of having too much alcohol.  If your health care provider says that alcohol is safe for you, follow these guidelines:  · Limit alcohol intake to no more than 1 drink per day for nonpregnant women and 2 drinks per day for men. One drink equals 12 oz of beer, 5 oz of wine, or 1½ oz of hard liquor.  · Do not drink on an empty stomach.  · Keep yourself hydrated with water, diet soda, or unsweetened iced tea.  · Keep in mind that regular soda, juice, and other mixers may contain a lot of sugar and must be counted as carbs.  What are tips for following this plan?    Reading food labels  · Start by checking the serving size on the "Nutrition Facts" label of packaged foods and drinks. The amount of calories, carbs, fats, and other nutrients listed on the label is based on one serving of the item. Many items contain more than one serving per package.  · Check the total grams (g) of carbs in one serving. You can calculate the number of servings of carbs in one serving by dividing the total carbs by 15. For example, if a food has 30 g of total carbs, it would be equal to 2   servings of carbs.  · Check the number of grams (g) of saturated and trans fats in one serving. Choose foods that have low or no amount of these fats.  · Check the number of milligrams (mg) of salt (sodium) in one serving. Most people should limit total sodium intake to less than 2,300 mg per day.  · Always check the nutrition information of foods labeled as "low-fat" or "nonfat". These foods may be higher in added sugar or refined carbs and should be avoided.  · Talk to your dietitian to identify your daily goals for nutrients listed on the label.  Shopping  · Avoid buying canned, premade, or processed foods. These foods tend to be high in fat, sodium,  and added sugar.  · Shop around the outside edge of the grocery store. This includes fresh fruits and vegetables, bulk grains, fresh meats, and fresh dairy.  Cooking  · Use low-heat cooking methods, such as baking, instead of high-heat cooking methods like deep frying.  · Cook using healthy oils, such as olive, canola, or sunflower oil.  · Avoid cooking with butter, cream, or high-fat meats.  Meal planning  · Eat meals and snacks regularly, preferably at the same times every day. Avoid going long periods of time without eating.  · Eat foods high in fiber, such as fresh fruits, vegetables, beans, and whole grains. Talk to your dietitian about how many servings of carbs you can eat at each meal.  · Eat 4-6 ounces (oz) of lean protein each day, such as lean meat, chicken, fish, eggs, or tofu. One oz of lean protein is equal to:  ? 1 oz of meat, chicken, or fish.  ? 1 egg.  ? ¼ cup of tofu.  · Eat some foods each day that contain healthy fats, such as avocado, nuts, seeds, and fish.  Lifestyle  · Check your blood glucose regularly.  · Exercise regularly as told by your health care provider. This may include:  ? 150 minutes of moderate-intensity or vigorous-intensity exercise each week. This could be brisk walking, biking, or water aerobics.  ? Stretching and doing strength exercises, such as yoga or weightlifting, at least 2 times a week.  · Take medicines as told by your health care provider.  · Do not use any products that contain nicotine or tobacco, such as cigarettes and e-cigarettes. If you need help quitting, ask your health care provider.  · Work with a counselor or diabetes educator to identify strategies to manage stress and any emotional and social challenges.  Questions to ask a health care provider  · Do I need to meet with a diabetes educator?  · Do I need to meet with a dietitian?  · What number can I call if I have questions?  · When are the best times to check my blood glucose?  Where to find more  information:  · American Diabetes Association: diabetes.org  · Academy of Nutrition and Dietetics: www.eatright.org  · National Institute of Diabetes and Digestive and Kidney Diseases (NIH): www.niddk.nih.gov  Summary  · A healthy meal plan will help you control your blood glucose and maintain a healthy lifestyle.  · Working with a diet and nutrition specialist (dietitian) can help you make a meal plan that is best for you.  · Keep in mind that carbohydrates (carbs) and alcohol have immediate effects on your blood glucose levels. It is important to count carbs and to use alcohol carefully.  This information is not intended to   replace advice given to you by your health care provider. Make sure you discuss any questions you have with your health care provider.  Document Released: 04/03/2005 Document Revised: 02/04/2017 Document Reviewed: 08/11/2016  Elsevier Interactive Patient Education © 2019 Elsevier Inc.

## 2018-09-13 ENCOUNTER — Other Ambulatory Visit (INDEPENDENT_AMBULATORY_CARE_PROVIDER_SITE_OTHER): Payer: PRIVATE HEALTH INSURANCE | Admitting: Internal Medicine

## 2018-09-13 DIAGNOSIS — G4733 Obstructive sleep apnea (adult) (pediatric): Secondary | ICD-10-CM

## 2018-09-13 NOTE — Progress Notes (Signed)
Patient picked up home sleep study , instructions verbal and written was given patient to return tomorrow.

## 2018-09-16 ENCOUNTER — Ambulatory Visit: Payer: PRIVATE HEALTH INSURANCE | Admitting: Internal Medicine

## 2018-09-16 ENCOUNTER — Encounter: Payer: Self-pay | Admitting: Internal Medicine

## 2018-09-16 VITALS — BP 120/84 | HR 95 | Resp 16 | Ht 71.0 in | Wt 279.0 lb

## 2018-09-16 DIAGNOSIS — Z9989 Dependence on other enabling machines and devices: Secondary | ICD-10-CM | POA: Diagnosis not present

## 2018-09-16 DIAGNOSIS — I10 Essential (primary) hypertension: Secondary | ICD-10-CM

## 2018-09-16 DIAGNOSIS — G4733 Obstructive sleep apnea (adult) (pediatric): Secondary | ICD-10-CM | POA: Diagnosis not present

## 2018-09-16 DIAGNOSIS — E1165 Type 2 diabetes mellitus with hyperglycemia: Secondary | ICD-10-CM | POA: Diagnosis not present

## 2018-09-16 NOTE — Patient Instructions (Signed)

## 2018-09-16 NOTE — Progress Notes (Signed)
[facility] [place], [place] [address]  Pulmonary Sleep Medicine   Office Visit Note  Patient Name: Lawrence Yu DOB: [birth year] MRN [id number]  Date of Service: 09/16/2018  Complaints/HPI: Pt is here for follow up on home sleep study.  He had 17 Obstructive apneas and 74 hypopneas during the study.  His total AHI was 13.2.  He will come in for a titration study, so we can get him a new machine at an appropriate pressure.   ROS  General: (-) fever, (-) chills, (-) night sweats, (-) weakness Skin: (-) rashes, (-) itching,. Eyes: (-) visual changes, (-) redness, (-) itching. Nose and Sinuses: (-) nasal stuffiness or itchiness, (-) postnasal drip, (-) nosebleeds, (-) sinus trouble. Mouth and Throat: (-) sore throat, (-) hoarseness. Neck: (-) swollen glands, (-) enlarged thyroid, (-) neck pain. Respiratory: - cough, (-) bloody sputum, - shortness of breath, - wheezing. Cardiovascular: - ankle swelling, (-) chest pain. Lymphatic: (-) lymph node enlargement. Neurologic: (-) numbness, (-) tingling. Psychiatric: (-) anxiety, (-) depression   Current Medication: Outpatient Encounter Medications as of 09/16/2018  Medication Sig  . Canagliflozin-metFORMIN HCl (INVOKAMET) 50-1000 MG TABS Take by mouth 2 (two) times daily.  . Choline Fenofibrate (FENOFIBRIC ACID) 135 MG CPDR Take by mouth.  Vanessa Kick Ethyl (VASCEPA) 1 g CAPS Take by mouth daily.  Marland Kitchen LIFESCAN FINEPOINT LANCETS MISC Use to check blood sugar 2 time(s) daily.  Marland Kitchen lisinopril (PRINIVIL,ZESTRIL) 5 MG tablet Take 1 tablet (5 mg total) by mouth daily.  Marland Kitchen omeprazole (PRILOSEC) 20 MG capsule Take 1 capsule (20 mg total) by mouth daily.  . sildenafil (REVATIO) 20 MG tablet Take 1 tablet (20 mg total) by mouth daily as needed. Take 2 tab daily as needed   No facility-administered encounter medications on file as of 09/16/2018.     Surgical History: Past Surgical History:  Procedure Laterality Date  .  APPENDECTOMY      Medical History: Past Medical History:  Diagnosis Date  . Diabetes mellitus without complication ([place])    pre diabetic  . GERD (gastroesophageal reflux disease)   . Hypertension     Family History: History reviewed. No pertinent family history.  Social History: Social History   Socioeconomic History  . Marital status: Married    Spouse name: Not on file  . Number of children: Not on file  . Years of education: Not on file  . Highest education level: Not on file  Occupational History  . Not on file  Social Needs  . Financial resource strain: Not on file  . Food insecurity:    Worry: Not on file    Inability: Not on file  . Transportation needs:    Medical: Not on file    Non-medical: Not on file  Tobacco Use  . Smoking status: Never Smoker  . Smokeless tobacco: Never Used  Substance and Sexual Activity  . Alcohol use: No  . Drug use: No  . Sexual activity: Not on file  Lifestyle  . Physical activity:    Days per week: Not on file    Minutes per session: Not on file  . Stress: Not on file  Relationships  . Social connections:    Talks on phone: Not on file    Gets together: Not on file    Attends religious service: Not on file    Active member of club or organization: Not on file    Attends meetings of clubs or organizations: Not on  file    Relationship status: Not on file  . Intimate partner violence:    Fear of current or ex partner: Not on file    Emotionally abused: Not on file    Physically abused: Not on file    Forced sexual activity: Not on file  Other Topics Concern  . Not on file  Social History Narrative  . Not on file    Vital Signs: Blood pressure 120/84, pulse 95, resp. rate 16, height 5\' 11"  (1.803 m), weight 279 lb (126.6 kg), SpO2 98 %.  Examination: General Appearance: The patient is well-developed, well-nourished, and in no distress. Skin: Gross inspection of skin unremarkable. Head: normocephalic, no gross  deformities. Eyes: no gross deformities noted. ENT: ears appear grossly normal no exudates. Neck: Supple. No thyromegaly. No LAD. Respiratory: clear bilaterally.. Cardiovascular: Normal S1 and S2 without murmur or rub. Extremities: No cyanosis. pulses are equal. Neurologic: Alert and oriented. No involuntary movements.  LABS: Recent Results (from the past 2160 hour(s))  POCT HgB A1C     Status: Abnormal   Collection Time: 09/02/18  4:21 PM  Result Value Ref Range   Hemoglobin A1C 7.3 (A) 4.0 - 5.6 %   HbA1c POC (<> result, manual entry)     HbA1c, POC (prediabetic range)     HbA1c, POC (controlled diabetic range)      Radiology: Dg Knee Complete 4 Views Left  Result Date: 08/04/2016 CLINICAL DATA:  Pain and swelling along the medial left knee. EXAM: LEFT KNEE - COMPLETE 4+ VIEW COMPARISON:  Two MRI from 10/15/2007, 04/19/2007 radiographs of the left knee. 2 G FINDINGS: Tubular soft tissue densities are again seen along the medial aspect of the calf consistent with varicose veins. Joint spaces are maintained. No acute fracture or malalignment. Ossific density adjacent to the supracondylar aspect of the medial femoral condyle was present in 2008 and may reflect an old remote MCL injury. IMPRESSION: No acute osseous abnormality noted radiographically. 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 Soft tissue ossification along the course of the proximal MCL may reflect an old remote injury is again noted. Electronically Signed   By: Ashley Royalty M.D.   On: 08/04/2016 18:48    No results found.  No results found.    Assessment and Plan: Patient Active Problem List   Diagnosis Date Noted  . Hypertension 05/26/2017  . Sleep apnea 05/26/2017  . Type 2 diabetes mellitus with other diabetic kidney complication ([place]) 97/35/3299  . Dyslipidemia with low high density lipoprotein (HDL) cholesterol with hypertriglyceridemia due to type 2 diabetes mellitus ([place]) 05/06/2017   1. OSA on CPAP CPAP  titration study ordered for patient to evaluate pressure need for new machine. - Cpap titration; Future  2. Hypertension, unspecified type Stable, continue current medications prescribed.  3. Uncontrolled type 2 diabetes mellitus with hyperglycemia (HCC) Stable, continue to follow-up with PCP and take medications as directed.   General Counseling: I have discussed the findings of the evaluation and examination with Carroll.  I have also discussed any further diagnostic evaluation thatmay be needed or ordered today. Rollie verbalizes understanding of the findings of todays visit. We also reviewed his medications today and discussed drug interactions and side effects including but not limited excessive drowsiness and altered mental states. We also discussed that there is always a risk not just to him but also people around him. he has been encouraged to call the office with any questions or concerns that should arise related to todays visit.  Time spent: 25 This patient was seen by Orson Gear AGNP-C in Collaboration with Dr. Devona Konig as a part of collaborative care agreement.   I have personally obtained a history, examined the patient, evaluated laboratory and imaging results, formulated the assessment and plan and placed orders.    Allyne Gee, MD [facility] Pulmonary and Critical Care Sleep medicine

## 2018-09-17 NOTE — Procedures (Signed)
[facility] [place], [place] [address]  Sleep Specialist: Allyne Gee, MD [facility] Sleep Study Interpretation  Patient Name: Lawrence Yu Patient MR OVZCHY:[id number] DOB:30-Sep-1970  Date of Study: September 13, 2018  Indications for study: Hypersomnia  BMI: 38.7 kg/m       Respiratory Data:  Total AHI: 13.2/h  Total Obstructive Apneas: 17  Total Central Apneas: 0  Total Mixed Apneas: 0  Total Hypopneas: 74  If the AHI is greater than 5 per hour patient qualifies for PAP evaluation  Oximetry Data:  Oxygen Desaturation Index: 18.5  Lowest Desaturation: 83%  Cardiac Data:  Minimum Heart Rate: 66  Maximum Heart Rate: 103   Impression / Diagnosis:  This apnea study is suggestive of mild obstructive sleep apnea with an AHI of 13.2.  In addition patient does have significant desaturations down to 83%.  CPAP study would be indicated in this case clinical correlation is recommended  GENERAL Recommendations:  1.  Consider Auto PAP with pressure ranges 5-20 cmH20 with download, or facility based PAP Titration Study  2.  Consider PAP interface mask fitted for patient comfort, Heated Humidification & PAP compliance monitoring (1 month, 3 months & 12 months after PAP initiation)  3. Consider treatment with mandibular advancement splint (MAS) or referral to an ENT surgeon for modification to the upper airway if the patient prefers an alternate therapy or the PAP trial is unsuccessful  4. Sleep hygiene measures should be discussed with the patient  5. Behavioral therapy such as weight reduction or smoking cessation as appropriate for the patient  6. Advise patient against the use of alcohol or sedatives in so much as these substances can worsen excessive daytime sleepiness and respiratory disturbances of sleep  7. Advise patient against participating in potentially dangerous activities while drowsy such as operating a motor vehicle,  heavy equipment or power tools as it can put them and others in danger  8. Advise patient of the long term consequences of OSA if left untreated, need for treatment and close follow up  9. Clinical follow up as deemed necessary     This Level III home sleep study was performed using the [employer], a 4 channel screening device subject to limitations. Depending on actual total sleep time, not measured in this study, the AHI (sum of apneas and hypopneas/hr of sleep) and therefore the severity of sleep apnea may be underestimated. As with any single night study, including Level 1 attended PSG, severity of sleep apnea may also be underestimated due to the lack of supine and/or REM sleep.  The interpretation associated with this report is based on normal values and degrees of severity in accordance with AASM parameters and/or estimated from multiple sources in the literature for adults ages 41-80+. These may not agree with the displayed values. The patient's treating physician should use the interpretation and recommendations in conjunction with the overall clinical evaluation and treatment of the patient.  Some of the terminology used in this scored [facility]neaLink report was developed several years ago and may not always be in accordance with current nomenclature. This in no way affects the accuracy of the data or the reliability of the interpretation and recommendations.

## 2018-09-28 ENCOUNTER — Encounter (INDEPENDENT_AMBULATORY_CARE_PROVIDER_SITE_OTHER): Payer: PRIVATE HEALTH INSURANCE | Admitting: Internal Medicine

## 2018-09-28 DIAGNOSIS — Z9989 Dependence on other enabling machines and devices: Secondary | ICD-10-CM | POA: Diagnosis not present

## 2018-09-28 DIAGNOSIS — G4733 Obstructive sleep apnea (adult) (pediatric): Secondary | ICD-10-CM

## 2018-09-29 ENCOUNTER — Encounter: Payer: Self-pay | Admitting: Internal Medicine

## 2018-10-08 ENCOUNTER — Ambulatory Visit: Payer: PRIVATE HEALTH INSURANCE | Admitting: Adult Health

## 2018-10-08 ENCOUNTER — Encounter: Payer: Self-pay | Admitting: Adult Health

## 2018-10-08 ENCOUNTER — Other Ambulatory Visit: Payer: Self-pay

## 2018-10-08 VITALS — BP 132/72 | HR 93 | Resp 16 | Ht 71.0 in | Wt 276.0 lb

## 2018-10-08 DIAGNOSIS — G4733 Obstructive sleep apnea (adult) (pediatric): Secondary | ICD-10-CM | POA: Diagnosis not present

## 2018-10-08 DIAGNOSIS — Z9989 Dependence on other enabling machines and devices: Secondary | ICD-10-CM

## 2018-10-08 DIAGNOSIS — I10 Essential (primary) hypertension: Secondary | ICD-10-CM | POA: Diagnosis not present

## 2018-10-08 NOTE — Progress Notes (Signed)
[facility] [place], [place] [address]  Pulmonary Sleep Medicine   Office Visit Note  Patient Name: Lawrence Yu DOB: [birth year] MRN [id number]  Date of Service: 10/08/2018  Complaints/HPI: PT is here for follow up on CPAP titration study.  His titration study shows stabilized respirations with an optimal nasal CPAP pressure of 9cm of water.  Will order patient a new machine with these improved settings. He has no complaints at this time.    ROS  General: (-) fever, (-) chills, (-) night sweats, (-) weakness Skin: (-) rashes, (-) itching,. Eyes: (-) visual changes, (-) redness, (-) itching. Nose and Sinuses: (-) nasal stuffiness or itchiness, (-) postnasal drip, (-) nosebleeds, (-) sinus trouble. Mouth and Throat: (-) sore throat, (-) hoarseness. Neck: (-) swollen glands, (-) enlarged thyroid, (-) neck pain. Respiratory: - cough, (-) bloody sputum, - shortness of breath, - wheezing. Cardiovascular: - ankle swelling, (-) chest pain. Lymphatic: (-) lymph node enlargement. Neurologic: (-) numbness, (-) tingling. Psychiatric: (-) anxiety, (-) depression   Current Medication: Outpatient Encounter Medications as of 10/08/2018  Medication Sig  . Canagliflozin-metFORMIN HCl (INVOKAMET) 50-1000 MG TABS Take by mouth 2 (two) times daily.  . Choline Fenofibrate (FENOFIBRIC ACID) 135 MG CPDR Take by mouth.  Vanessa Kick Ethyl (VASCEPA) 1 g CAPS Take by mouth daily.  Marland Kitchen LIFESCAN FINEPOINT LANCETS MISC Use to check blood sugar 2 time(s) daily.  Marland Kitchen lisinopril (PRINIVIL,ZESTRIL) 5 MG tablet Take 1 tablet (5 mg total) by mouth daily.  Marland Kitchen omeprazole (PRILOSEC) 20 MG capsule Take 1 capsule (20 mg total) by mouth daily.  . sildenafil (REVATIO) 20 MG tablet Take 1 tablet (20 mg total) by mouth daily as needed. Take 2 tab daily as needed   No facility-administered encounter medications on file as of 10/08/2018.     Surgical History: Past Surgical History:  Procedure  Laterality Date  . APPENDECTOMY      Medical History: Past Medical History:  Diagnosis Date  . Diabetes mellitus without complication ([place])    pre diabetic  . GERD (gastroesophageal reflux disease)   . Hypertension     Family History: No family history on file.  Social History: Social History   Socioeconomic History  . Marital status: Married    Spouse name: Not on file  . Number of children: Not on file  . Years of education: Not on file  . Highest education level: Not on file  Occupational History  . Not on file  Social Needs  . Financial resource strain: Not on file  . Food insecurity:    Worry: Not on file    Inability: Not on file  . Transportation needs:    Medical: Not on file    Non-medical: Not on file  Tobacco Use  . Smoking status: Never Smoker  . Smokeless tobacco: Never Used  Substance and Sexual Activity  . Alcohol use: No  . Drug use: No  . Sexual activity: Not on file  Lifestyle  . Physical activity:    Days per week: Not on file    Minutes per session: Not on file  . Stress: Not on file  Relationships  . Social connections:    Talks on phone: Not on file    Gets together: Not on file    Attends religious service: Not on file    Active member of club or organization: Not on file    Attends meetings of clubs or organizations: Not on file  Relationship status: Not on file  . Intimate partner violence:    Fear of current or ex partner: Not on file    Emotionally abused: Not on file    Physically abused: Not on file    Forced sexual activity: Not on file  Other Topics Concern  . Not on file  Social History Narrative  . Not on file    Vital Signs: There were no vitals taken for this visit.  Examination: General Appearance: The patient is well-developed, well-nourished, and in no distress. Skin: Gross inspection of skin unremarkable. Head: normocephalic, no gross deformities. Eyes: no gross deformities noted. ENT: ears appear  grossly normal no exudates. Neck: Supple. No thyromegaly. No LAD. Respiratory: clear bilaterally. Cardiovascular: Normal S1 and S2 without murmur or rub. Extremities: No cyanosis. pulses are equal. Neurologic: Alert and oriented. No involuntary movements.  LABS: Recent Results (from the past 2160 hour(s))  POCT HgB A1C     Status: Abnormal   Collection Time: 09/02/18  4:21 PM  Result Value Ref Range   Hemoglobin A1C 7.3 (A) 4.0 - 5.6 %   HbA1c POC (<> result, manual entry)     HbA1c, POC (prediabetic range)     HbA1c, POC (controlled diabetic range)      Radiology: Dg Knee Complete 4 Views Left  Result Date: 08/04/2016 CLINICAL DATA:  Pain and swelling along the medial left knee. EXAM: LEFT KNEE - COMPLETE 4+ VIEW COMPARISON:  Two MRI from 10/15/2007, 04/19/2007 radiographs of the left knee. 2 G FINDINGS: Tubular soft tissue densities are again seen along the medial aspect of the calf consistent with varicose veins. Joint spaces are maintained. No acute fracture or malalignment. Ossific density adjacent to the supracondylar aspect of the medial femoral condyle was present in 2008 and may reflect an old remote MCL injury. IMPRESSION: No acute osseous abnormality noted radiographically. 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 Soft tissue ossification along the course of the proximal MCL may reflect an old remote injury is again noted. Electronically Signed   By: Ashley Royalty M.D.   On: 08/04/2016 18:48    No results found.  No results found.    Assessment and Plan: Patient Active Problem List   Diagnosis Date Noted  . Hypertension 05/26/2017  . Sleep apnea 05/26/2017  . Type 2 diabetes mellitus with other diabetic kidney complication ([place]) 02/63/7858  . Dyslipidemia with low high density lipoprotein (HDL) cholesterol with hypertriglyceridemia due to type 2 diabetes mellitus ([place]) 05/06/2017    1. OSA on CPAP Pt will get CPAP machine with optimal pressure 9cm h20. (Pt uses [facility] patient currently)  - For home use only DME continuous positive airway pressure (CPAP)  2. Hypertension, unspecified type Stable, continue present management.   General Counseling: I have discussed the findings of the evaluation and examination with [place].  I have also discussed any further diagnostic evaluation thatmay be needed or ordered today. Ara verbalizes understanding of the findings of todays visit. We also reviewed his medications today and discussed drug interactions and side effects including but not limited excessive drowsiness and altered mental states. We also discussed that there is always a risk not just to him but also people around him. he has been encouraged to call the office with any questions or concerns that should arise related to todays visit.    Time spent: 25 This patient was seen by Orson Gear AGNP-C in Collaboration with Dr. Devona Konig as a part of collaborative care agreement.  I have personally obtained a history, examined the patient, evaluated laboratory and imaging results, formulated the assessment and plan and placed orders.    Allyne Gee, MD [facility] Pulmonary and Critical Care Sleep medicine

## 2018-10-08 NOTE — Patient Instructions (Signed)

## 2018-10-10 NOTE — Procedures (Signed)
[facility] [address] [place], [place] [address]  Patient Name: Lawrence Yu DOB: Apr 21, 1971   SLEEP STUDY INTERPRETATION  DATE OF SERVICE: September 28, 2018   SLEEP STUDY HISTORY: This patient is referred to the sleep lab for a baseline Polysomnography. Pertinent history includes a history of diagnosis of excessive daytime somnolence and snoring.  PROCEDURE: This overnight polysomnogram was performed using the [employer] 5 acquisition system using the standard diagnostic protocol as outlined by the AASM. This includes 6 channels of EEG, 2 channelscannels of EOG, chin EMG, bilateral anterior tibialis EMG, nasal/oral thermister, PTAF, chest and abdominal wall movements, ECG and pulse oximetry. Apneas and Hypopneas were scored per AASM definition.  SLEEP ARCHITECHTURE: This is a baseline polysomnograph  study. The total recording time was 421.8 minutes and the patients total sleep time is noted to be 302.0 minutes. Sleep onset latency was 36.5 minutes and is prolonged.  Stage R sleep onset latency was 123.5 minutes. Sleep maintenance efficiency was 71.9 % and is reduced.  Sleep staging expressed as a percentage of total sleep time demonstrated 27.3 % N1, 48.5 % N2 and 12.3 % N3  sleep. Stage R represents 11.9 % of total sleep time. This is decreased.  There were a total of 45 arousals  for an overall arousal index of 8.9 per hour of sleep. PLMS arousal were not noted. Arousals without respiratory events are  noted. This can contribute to sleep architechture disruption.  RESPIRATORY MONITORING:   Patient exhibits no significant evidence of sleep disorderd breathing characterized by 0 central apneas, 0 obstructive apneas and 0 mixed apneas. There were 18 obstructive hypopneas and 0 RERAs. Most of the apneas/hypopneas were of obstructive variety. The total apnea hypopnea index (apneas and hypopneas per hour of sleep) is 3.6 respiratory events per hour and is within normal  limits.  Respiratory monitoring demonstrated mild snoring through the night. There are a total of 173 snoring episodes representing 6.7 % of sleep.   Baseline oxygen saturation during wakefulness was 94 % and during NREM sleep averaged 94 % through the night. Arterial saturation during REM sleep was 94 % through the night. There was significant  oxygen desaturation with the respiratory events. Arterial oxygen desaturation occurred of at least 4% was noted with a low saturation of 88 %. The study was performed off oxygen.  CARDIAC MONITORING:   Average heart rate is 85 during sleep with a high of 102 beats per minute. Malignant arrhythmias were not noted.  CPAP titration: Patient was started on CPAP titration per lab protocol.  The patient was started on a CPAP pressure of 5 and increased to a pressure of 9 cm water pressure.  Patient did exhibit stage bar during the titration.  On a CPAP of 9 cm water pressure patient had no apneas and 3 hypopneas for an AHI of 2.6/h.  Lowest oxygen saturation was 93%.  Patient did tolerate the CPAP fairly well  IMPRESSIONS:  --This overnight polysomnogram demonstrates insignificant obstructive sleep apnea with an overall AHI 3.6 per hour once the patient was on CPAP therapy. --The overall AHI was no worse  during Stage R. --There were associated mild arterial oxygen desaturations noted lowest saturation 88%.  This was resolved with the optimal CPAP pressure. --There was no significant PLMS noted in this study. --There is some snoring but the CPAP was able to obliterate the snoring noted throughout the study.    RECOMMENDATIONS:  --CPAP titration study is adequate to control the patient's obstructive  sleep apnea.  The optimal CPAP pressure appears to be at a pressure of 9 cm water pressure.. --Nasal decongestants and antihistamines may be of help for increased upper airways resistance when present. --Weight loss through dietary and lifestyle modification is  recommended in the presence of obesity. --A search for and treatment of any underlying cardiopulmonary disease is      recommended in the presence of oxygen desaturations. --Alternative treatment options if the patient is not willing to use CPAP include oral   appliances as well as surgical intervention which may help in the appropriate patient. --Clinical correlation is recommended. Please feel free to call the office for any further  questions or assistance in the care of this patient.     Allyne Gee, MD [facility] Pulmonary Critical Care Medicine Sleep medicine

## 2018-10-14 ENCOUNTER — Telehealth: Payer: Self-pay

## 2018-10-14 NOTE — Telephone Encounter (Signed)
[facility] Patient new cpap order for set up. Lawrence Yu

## 2018-10-20 ENCOUNTER — Other Ambulatory Visit: Payer: Self-pay | Admitting: Adult Health

## 2018-10-20 DIAGNOSIS — Z9989 Dependence on other enabling machines and devices: Principal | ICD-10-CM

## 2018-10-20 DIAGNOSIS — G4733 Obstructive sleep apnea (adult) (pediatric): Secondary | ICD-10-CM

## 2018-10-20 NOTE — Progress Notes (Signed)
PSG ordered, study already complete

## 2018-10-23 ENCOUNTER — Other Ambulatory Visit: Payer: Self-pay | Admitting: Adult Health

## 2018-10-23 DIAGNOSIS — N529 Male erectile dysfunction, unspecified: Secondary | ICD-10-CM

## 2018-11-16 ENCOUNTER — Telehealth: Payer: Self-pay

## 2018-11-16 NOTE — Telephone Encounter (Signed)
Signed CMN for cpap and put in [facility]. Beth

## 2018-12-22 ENCOUNTER — Ambulatory Visit: Payer: PRIVATE HEALTH INSURANCE | Admitting: Nurse Practitioner

## 2018-12-28 ENCOUNTER — Other Ambulatory Visit: Payer: Self-pay

## 2018-12-28 ENCOUNTER — Encounter: Payer: Self-pay | Admitting: Adult Health

## 2018-12-28 ENCOUNTER — Other Ambulatory Visit: Payer: Self-pay | Admitting: Adult Health

## 2018-12-28 ENCOUNTER — Ambulatory Visit: Payer: PRIVATE HEALTH INSURANCE | Admitting: Adult Health

## 2018-12-28 VITALS — Ht 71.0 in | Wt 276.0 lb

## 2018-12-28 DIAGNOSIS — N529 Male erectile dysfunction, unspecified: Secondary | ICD-10-CM

## 2018-12-28 DIAGNOSIS — I10 Essential (primary) hypertension: Secondary | ICD-10-CM

## 2018-12-28 DIAGNOSIS — G4733 Obstructive sleep apnea (adult) (pediatric): Secondary | ICD-10-CM | POA: Diagnosis not present

## 2018-12-28 DIAGNOSIS — Z9989 Dependence on other enabling machines and devices: Secondary | ICD-10-CM

## 2018-12-28 DIAGNOSIS — Z0001 Encounter for general adult medical examination with abnormal findings: Secondary | ICD-10-CM

## 2018-12-28 NOTE — Progress Notes (Signed)
Physical labs sent for [facility] to [facility]

## 2018-12-28 NOTE — Progress Notes (Signed)
[facility] [place], [place] [address]  Internal MEDICINE  Telephone Visit  Patient Name: Lawrence Yu  [id number]  [id number]  Date of Service: 12/28/2018  I connected with the patient at  955 by telephone and verified the patients identity using two identifiers.   I discussed the limitations, risks, security and privacy concerns of performing an evaluation and management service by telephone and the availability of in person appointments. I also discussed with the patient that there may be a patient responsible charge related to the service.  The patient expressed understanding and agrees to proceed.    Chief Complaint  Patient presents with  . Telephone Assessment  . Telephone Screen  . Sleep Apnea    cpap    HPI  PT seen via video for follow up on osa.  He reports he is wearing his cpap nightly.  HE denies any issues with mask seal.  He is cleaning his machine regularly, and changing his tubing and seal as discussed. He reports good relief of symptoms.  He denies any headaches, or feeling tired during the day.     Current Medication: Outpatient Encounter Medications as of 12/28/2018  Medication Sig  . Canagliflozin-metFORMIN HCl (INVOKAMET) 50-1000 MG TABS Take by mouth 2 (two) times daily.  . Choline Fenofibrate (FENOFIBRIC ACID) 135 MG CPDR Take by mouth.  Vanessa Kick Ethyl (VASCEPA) 1 g CAPS Take by mouth daily.  Marland Kitchen LIFESCAN FINEPOINT LANCETS MISC Use to check blood sugar 2 time(s) daily.  Marland Kitchen lisinopril (PRINIVIL,ZESTRIL) 5 MG tablet Take 1 tablet (5 mg total) by mouth daily.  Marland Kitchen omeprazole (PRILOSEC) 20 MG capsule Take 1 capsule (20 mg total) by mouth daily.  . sildenafil (REVATIO) 20 MG tablet Take 1 tablet (20 mg total) by mouth daily as needed. Take 2 tab daily as needed   No facility-administered encounter medications on file as of 12/28/2018.     Surgical History: Past Surgical History:  Procedure Laterality Date  . APPENDECTOMY       Medical History: Past Medical History:  Diagnosis Date  . Diabetes mellitus without complication ([place])    pre diabetic  . GERD (gastroesophageal reflux disease)   . Hypertension     Family History: History reviewed. No pertinent family history.  Social History   Socioeconomic History  . Marital status: Married    Spouse name: Not on file  . Number of children: Not on file  . Years of education: Not on file  . Highest education level: Not on file  Occupational History  . Not on file  Social Needs  . Financial resource strain: Not on file  . Food insecurity:    Worry: Not on file    Inability: Not on file  . Transportation needs:    Medical: Not on file    Non-medical: Not on file  Tobacco Use  . Smoking status: Never Smoker  . Smokeless tobacco: Never Used  Substance and Sexual Activity  . Alcohol use: No  . Drug use: No  . Sexual activity: Not on file  Lifestyle  . Physical activity:    Days per week: Not on file    Minutes per session: Not on file  . Stress: Not on file  Relationships  . Social connections:    Talks on phone: Not on file    Gets together: Not on file    Attends religious service: Not on file    Active member of club or organization: Not on  file    Attends meetings of clubs or organizations: Not on file    Relationship status: Not on file  . Intimate partner violence:    Fear of current or ex partner: Not on file    Emotionally abused: Not on file    Physically abused: Not on file    Forced sexual activity: Not on file  Other Topics Concern  . Not on file  Social History Narrative  . Not on file      Review of Systems  Constitutional: Negative.  Negative for chills, fatigue and unexpected weight change.  HENT: Negative.  Negative for congestion, rhinorrhea, sneezing and sore throat.   Eyes: Negative for redness.  Respiratory: Negative.  Negative for cough, chest tightness and shortness of breath.   Cardiovascular: Negative.   Negative for chest pain and palpitations.  Gastrointestinal: Negative.  Negative for abdominal pain, constipation, diarrhea, nausea and vomiting.  Endocrine: Negative.   Genitourinary: Negative.  Negative for dysuria and frequency.  Musculoskeletal: Negative.  Negative for arthralgias, back pain, joint swelling and neck pain.  Skin: Negative.  Negative for rash.  Allergic/Immunologic: Negative.   Neurological: Negative.  Negative for tremors and numbness.  Hematological: Negative for adenopathy. Does not bruise/bleed easily.  Psychiatric/Behavioral: Negative.  Negative for behavioral problems, sleep disturbance and suicidal ideas. The patient is not nervous/anxious.     Vital Signs: Ht 5\' 11"  (1.803 m)   Wt 276 lb (125.2 kg)   BMI 38.49 kg/m    Observation/Objective:  Speaking in full sentences. NAD noted at this time.    Assessment/Plan: 1. OSA on CPAP Continue using cpap as discussed.  Having good benefit.  Continue cleaning, and maintaining machine.    2. Hypertension, unspecified type Stable, continue present management.   3. Erectile dysfunction, unspecified erectile dysfunction type Pt has ongoing issues with maintaining erection despite sildenafil. Encouraged patient to have testosterone and prolactin checked. Follow up with PCp.    General Counseling: [place] verbalizes understanding of the findings of today's phone visit and agrees with plan of treatment. I have discussed any further diagnostic evaluation that may be needed or ordered today. We also reviewed his medications today. he has been encouraged to call the office with any questions or concerns that should arise related to todays visit.    No orders of the defined types were placed in this encounter.   No orders of the defined types were placed in this encounter.   Time spent: 11Minutes    Orson Gear AGNP-C Pulmonary medicine

## 2018-12-29 ENCOUNTER — Telehealth: Payer: Self-pay

## 2018-12-29 DIAGNOSIS — N529 Male erectile dysfunction, unspecified: Secondary | ICD-10-CM

## 2018-12-29 DIAGNOSIS — R7989 Other specified abnormal findings of blood chemistry: Secondary | ICD-10-CM

## 2019-01-02 NOTE — Telephone Encounter (Signed)
Ordered for endocrinology given

## 2019-01-05 LAB — COMPREHENSIVE METABOLIC PANEL
ALT: 37 IU/L (ref 0–44)
AST: 27 IU/L (ref 0–40)
Albumin/Globulin Ratio: 1.7 (ref 1.2–2.2)
Albumin: 4.3 g/dL (ref 4.0–5.0)
Alkaline Phosphatase: 66 IU/L (ref 39–117)
BUN/Creatinine Ratio: 20 (ref 9–20)
BUN: 18 mg/dL (ref 6–24)
Bilirubin Total: 0.6 mg/dL (ref 0.0–1.2)
CO2: 21 mmol/L (ref 20–29)
Calcium: 9.3 mg/dL (ref 8.7–10.2)
Chloride: 98 mmol/L (ref 96–106)
Creatinine, Ser: 0.89 mg/dL (ref 0.76–1.27)
GFR calc Af Amer: 117 mL/min/{1.73_m2} (ref >59)
GFR calc non Af Amer: 101 mL/min/{1.73_m2} (ref >59)
Globulin, Total: 2.6 g/dL (ref 1.5–4.5)
Glucose: 171 mg/dL — ABNORMAL HIGH (ref 65–99)
Potassium: 4.2 mmol/L (ref 3.5–5.2)
Sodium: 139 mmol/L (ref 134–144)
Total Protein: 6.9 g/dL (ref 6.0–8.5)

## 2019-01-05 LAB — PROLACTIN: Prolactin: 4.3 ng/mL (ref 4.0–15.2)

## 2019-01-05 LAB — CBC WITH DIFFERENTIAL/PLATELET
Basophils Absolute: 0 10*3/uL (ref 0.0–0.2)
Basos: 1 %
EOS (ABSOLUTE): 0.1 10*3/uL (ref 0.0–0.4)
Eos: 3 %
Hematocrit: 44.3 % (ref 37.5–51.0)
Hemoglobin: 16.3 g/dL (ref 13.0–17.7)
Immature Grans (Abs): 0 10*3/uL (ref 0.0–0.1)
Immature Granulocytes: 0 %
Lymphocytes Absolute: 1.6 10*3/uL (ref 0.7–3.1)
Lymphs: 38 %
MCH: 32.1 pg (ref 26.6–33.0)
MCHC: 36.8 g/dL — ABNORMAL HIGH (ref 31.5–35.7)
MCV: 87 fL (ref 79–97)
Monocytes Absolute: 0.3 10*3/uL (ref 0.1–0.9)
Monocytes: 7 %
Neutrophils Absolute: 2.2 10*3/uL (ref 1.4–7.0)
Neutrophils: 51 %
Platelets: 146 10*3/uL — ABNORMAL LOW (ref 150–450)
RBC: 5.08 x10E6/uL (ref 4.14–5.80)
RDW: 13.8 % (ref 11.6–15.4)
WBC: 4.3 10*3/uL (ref 3.4–10.8)

## 2019-01-05 LAB — TESTOSTERONE,FREE AND TOTAL: Testosterone: 277 ng/dL (ref 264–916)

## 2019-01-05 LAB — PSA: Prostate Specific Ag, Serum: 0.5 ng/mL (ref 0.0–4.0)

## 2019-01-05 LAB — TSH: TSH: 1.25 u[IU]/mL (ref 0.450–4.500)

## 2019-01-05 LAB — HEMOGLOBIN A1C
Est. average glucose Bld gHb Est-mCnc: 166 mg/dL
Hgb A1c MFr Bld: 7.4 % — ABNORMAL HIGH (ref 4.8–5.6)

## 2019-01-05 LAB — LIPID PANEL WITH LDL/HDL RATIO
Cholesterol, Total: 395 mg/dL — ABNORMAL HIGH (ref 100–199)
HDL: 17 mg/dL — ABNORMAL LOW (ref >39)
Triglycerides: 2658 mg/dL (ref 0–149)

## 2019-01-05 LAB — T4, FREE: Free T4: 1.37 ng/dL (ref 0.82–1.77)

## 2019-01-06 ENCOUNTER — Other Ambulatory Visit: Payer: Self-pay

## 2019-01-07 ENCOUNTER — Other Ambulatory Visit: Payer: Self-pay

## 2019-01-07 MED ORDER — OMEPRAZOLE 20 MG PO CPDR: 20.0000 mg | DELAYED_RELEASE_CAPSULE | Freq: Every day | ORAL | 1 refills | Status: DC

## 2019-01-10 ENCOUNTER — Other Ambulatory Visit: Payer: Self-pay

## 2019-01-10 ENCOUNTER — Encounter: Payer: Self-pay | Admitting: Adult Health

## 2019-01-10 ENCOUNTER — Ambulatory Visit: Payer: PRIVATE HEALTH INSURANCE | Admitting: Adult Health

## 2019-01-10 VITALS — BP 148/88 | HR 96 | Resp 16 | Ht 71.0 in | Wt 275.0 lb

## 2019-01-10 DIAGNOSIS — G4733 Obstructive sleep apnea (adult) (pediatric): Secondary | ICD-10-CM

## 2019-01-10 DIAGNOSIS — E781 Pure hyperglyceridemia: Secondary | ICD-10-CM

## 2019-01-10 DIAGNOSIS — I10 Essential (primary) hypertension: Secondary | ICD-10-CM

## 2019-01-10 DIAGNOSIS — E1165 Type 2 diabetes mellitus with hyperglycemia: Secondary | ICD-10-CM

## 2019-01-10 DIAGNOSIS — Z6838 Body mass index (BMI) 38.0-38.9, adult: Secondary | ICD-10-CM

## 2019-01-10 DIAGNOSIS — Z9989 Dependence on other enabling machines and devices: Secondary | ICD-10-CM

## 2019-01-10 MED ORDER — INVOKAMET 50-1000 MG PO TABS: 1.0000 | ORAL_TABLET | Freq: Two times a day (BID) | ORAL | 3 refills | Status: DC

## 2019-01-10 MED ORDER — VASCEPA 1 G PO CAPS: 2.0000 g | ORAL_CAPSULE | Freq: Two times a day (BID) | ORAL | 3 refills | Status: DC

## 2019-01-10 MED ORDER — FENOFIBRIC ACID 135 MG PO CPDR: 1.0000 | DELAYED_RELEASE_CAPSULE | Freq: Every day | ORAL | 3 refills | Status: DC

## 2019-01-10 NOTE — Progress Notes (Signed)
[facility] [place], [place] [address]  Internal MEDICINE  Office Visit Note  Patient Name: Lawrence Yu  [id number]  [id number]  Date of Service: 01/10/2019  Chief Complaint  Patient presents with  . Diabetes  . Follow-up  . Medication Refill    HPI  Pt is here for follow up on lab work. Triglycerides were found to be 2658, he denies any pain or feeling bad at this time.  He reports his triglycerides have been elevated in the past. He is currently taking Vascepa and fenofibric acid for this. He has been taking them incorrectly upon evaluation. Encouraged patient to take full dose,and not half dose that he has been taking.     Current Medication: Outpatient Encounter Medications as of 01/10/2019  Medication Sig  . Canagliflozin-metFORMIN HCl (INVOKAMET) 50-1000 MG TABS Take 1 tablet by mouth 2 (two) times daily. Take 1 tab po 2 times daily  . Choline Fenofibrate (FENOFIBRIC ACID) 135 MG CPDR Take 1 tablet by mouth daily. Take 1 tab po daily  . Icosapent Ethyl (VASCEPA) 1 g CAPS Take 2 capsules (2 g total) by mouth 2 (two) times a day.  Marland Kitchen LIFESCAN FINEPOINT LANCETS MISC Use to check blood sugar 2 time(s) daily.  Marland Kitchen lisinopril (PRINIVIL,ZESTRIL) 5 MG tablet Take 1 tablet (5 mg total) by mouth daily.  Marland Kitchen omeprazole (PRILOSEC) 20 MG capsule Take 1 capsule (20 mg total) by mouth daily.  . sildenafil (REVATIO) 20 MG tablet Take 1 tablet (20 mg total) by mouth daily as needed. Take 2 tab daily as needed  . [DISCONTINUED] Canagliflozin-metFORMIN HCl (INVOKAMET) 50-1000 MG TABS Take 1 tab po 2 times daily  . [DISCONTINUED] Choline Fenofibrate (FENOFIBRIC ACID) 135 MG CPDR Take by mouth. Take 1 tab po daily  . [DISCONTINUED] Icosapent Ethyl (VASCEPA) 1 g CAPS Take by mouth daily.   No facility-administered encounter medications on file as of 01/10/2019.     Surgical History: Past Surgical History:  Procedure Laterality Date  . APPENDECTOMY      Medical  History: Past Medical History:  Diagnosis Date  . Diabetes mellitus without complication ([place])    pre diabetic  . GERD (gastroesophageal reflux disease)   . Hypertension     Family History: History reviewed. No pertinent family history.  Social History   Socioeconomic History  . Marital status: Married    Spouse name: Not on file  . Number of children: Not on file  . Years of education: Not on file  . Highest education level: Not on file  Occupational History  . Not on file  Social Needs  . Financial resource strain: Not on file  . Food insecurity    Worry: Not on file    Inability: Not on file  . Transportation needs    Medical: Not on file    Non-medical: Not on file  Tobacco Use  . Smoking status: Never Smoker  . Smokeless tobacco: Never Used  Substance and Sexual Activity  . Alcohol use: No  . Drug use: No  . Sexual activity: Not on file  Lifestyle  . Physical activity    Days per week: Not on file    Minutes per session: Not on file  . Stress: Not on file  Relationships  . Social Herbalist on phone: Not on file    Gets together: Not on file    Attends religious service: Not on file    Active member of club or  organization: Not on file    Attends meetings of clubs or organizations: Not on file    Relationship status: Not on file  . Intimate partner violence    Fear of current or ex partner: Not on file    Emotionally abused: Not on file    Physically abused: Not on file    Forced sexual activity: Not on file  Other Topics Concern  . Not on file  Social History Narrative  . Not on file      Review of Systems  Constitutional: Negative.  Negative for chills, fatigue and unexpected weight change.  HENT: Negative.  Negative for congestion, rhinorrhea, sneezing and sore throat.   Eyes: Negative for redness.  Respiratory: Negative.  Negative for cough, chest tightness and shortness of breath.   Cardiovascular: Negative.  Negative for chest  pain and palpitations.  Gastrointestinal: Negative.  Negative for abdominal pain, constipation, diarrhea, nausea and vomiting.  Endocrine: Negative.   Genitourinary: Negative.  Negative for dysuria and frequency.  Musculoskeletal: Negative.  Negative for arthralgias, back pain, joint swelling and neck pain.  Skin: Negative.  Negative for rash.  Allergic/Immunologic: Negative.   Neurological: Negative.  Negative for tremors and numbness.  Hematological: Negative for adenopathy. Does not bruise/bleed easily.  Psychiatric/Behavioral: Negative.  Negative for behavioral problems, sleep disturbance and suicidal ideas. The patient is not nervous/anxious.     Vital Signs: BP (!) 148/88   Pulse 96   Resp 16   Ht 5\' 11"  (1.803 m)   Wt 275 lb (124.7 kg)   SpO2 94%   BMI 38.35 kg/m    Physical Exam Vitals signs and nursing note reviewed.  Constitutional:      General: He is not in acute distress.    Appearance: He is well-developed. He is not diaphoretic.  HENT:     Head: Normocephalic and atraumatic.     Mouth/Throat:     Pharynx: No oropharyngeal exudate.  Eyes:     Pupils: Pupils are equal, round, and reactive to light.  Neck:     Musculoskeletal: Normal range of motion and neck supple.     Thyroid: No thyromegaly.     Vascular: No JVD.     Trachea: No tracheal deviation.  Cardiovascular:     Rate and Rhythm: Normal rate and regular rhythm.     Heart sounds: Normal heart sounds. No murmur. No friction rub. No gallop.   Pulmonary:     Effort: Pulmonary effort is normal. No respiratory distress.     Breath sounds: Normal breath sounds. No wheezing or rales.  Chest:     Chest wall: No tenderness.  Abdominal:     Palpations: Abdomen is soft.     Tenderness: There is no abdominal tenderness. There is no guarding.  Musculoskeletal: Normal range of motion.  Lymphadenopathy:     Cervical: No cervical adenopathy.  Skin:    General: Skin is warm and dry.  Neurological:     Mental  Status: He is alert and oriented to person, place, and time.     Cranial Nerves: No cranial nerve deficit.  Psychiatric:        Behavior: Behavior normal.        Thought Content: Thought content normal.        Judgment: Judgment normal.     Assessment/Plan: 1. Hypertriglyceridemia Take medications as prescribed, and follow up  In 10 weeks with no triglycerides levels. - Choline Fenofibrate (FENOFIBRIC ACID) 135 MG CPDR; Take 1 tablet by  mouth daily. Take 1 tab po daily  Dispense: 90 capsule; Refill: 3 - Icosapent Ethyl (VASCEPA) 1 g CAPS; Take 2 capsules (2 g total) by mouth 2 (two) times a day.  Dispense: 360 capsule; Refill: 3  2. Uncontrolled type 2 diabetes mellitus with hyperglycemia ([place]) Refilled patients Invokamet, continue present management.  - Canagliflozin-metFORMIN HCl (INVOKAMET) 50-1000 MG TABS; Take 1 tablet by mouth 2 (two) times daily. Take 1 tab po 2 times daily  Dispense: 180 tablet; Refill: 3  3. OSA on CPAP Stable, continue to use cpap as ordered.   4. Class 2 severe obesity due to excess calories with serious comorbidity and body mass index (BMI) of 38.0 to 38.9 in adult [facility]) Obesity Counseling: Risk Assessment: An assessment of behavioral risk factors was made today and includes lack of exercise sedentary lifestyle, lack of portion control and poor dietary habits.  5. Hypertension, unspecified type BP elevated slightly today, 148/88, encouraged patient to take his medications and take bp at home.  Risk Modification Advice: She was counseled on portion control guidelines. Restricting daily caloric intake to. . The detrimental long term effects of obesity on her health and ongoing poor compliance was also discussed with the patient.    General Counseling: Iram verbalizes understanding of the findings of todays visit and agrees with plan of treatment. I have discussed any further diagnostic evaluation that may be needed or ordered today. We also reviewed his  medications today. he has been encouraged to call the office with any questions or concerns that should arise related to todays visit.    No orders of the defined types were placed in this encounter.   Meds ordered this encounter  Medications  . Choline Fenofibrate (FENOFIBRIC ACID) 135 MG CPDR    Sig: Take 1 tablet by mouth daily. Take 1 tab po daily    Dispense:  90 capsule    Refill:  3  . Canagliflozin-metFORMIN HCl (INVOKAMET) 50-1000 MG TABS    Sig: Take 1 tablet by mouth 2 (two) times daily. Take 1 tab po 2 times daily    Dispense:  180 tablet    Refill:  3  . Icosapent Ethyl (VASCEPA) 1 g CAPS    Sig: Take 2 capsules (2 g total) by mouth 2 (two) times a day.    Dispense:  360 capsule    Refill:  3    Time spent: 25 Minutes   This patient was seen by Orson Gear AGNP-C in Collaboration with Dr Lavera Guise as a part of collaborative care agreement     Kendell Bane AGNP-C Internal medicine

## 2019-01-11 ENCOUNTER — Other Ambulatory Visit: Payer: Self-pay

## 2019-01-11 MED ORDER — LISINOPRIL 5 MG PO TABS: 5.0000 mg | ORAL_TABLET | Freq: Every day | ORAL | 2 refills | Status: DC

## 2019-03-16 ENCOUNTER — Ambulatory Visit: Payer: PRIVATE HEALTH INSURANCE

## 2019-03-21 ENCOUNTER — Ambulatory Visit: Payer: PRIVATE HEALTH INSURANCE | Admitting: Adult Health

## 2019-03-29 ENCOUNTER — Ambulatory Visit: Payer: PRIVATE HEALTH INSURANCE | Admitting: Internal Medicine

## 2019-03-30 ENCOUNTER — Ambulatory Visit: Payer: PRIVATE HEALTH INSURANCE

## 2019-04-07 ENCOUNTER — Other Ambulatory Visit: Payer: Self-pay

## 2019-04-07 ENCOUNTER — Encounter: Payer: Self-pay | Admitting: Internal Medicine

## 2019-04-07 ENCOUNTER — Ambulatory Visit: Payer: PRIVATE HEALTH INSURANCE | Admitting: Internal Medicine

## 2019-04-07 VITALS — BP 140/82 | HR 90 | Resp 16 | Ht 71.0 in | Wt 275.0 lb

## 2019-04-07 DIAGNOSIS — Z6838 Body mass index (BMI) 38.0-38.9, adult: Secondary | ICD-10-CM

## 2019-04-07 DIAGNOSIS — Z9989 Dependence on other enabling machines and devices: Secondary | ICD-10-CM

## 2019-04-07 DIAGNOSIS — G4733 Obstructive sleep apnea (adult) (pediatric): Secondary | ICD-10-CM | POA: Diagnosis not present

## 2019-04-07 DIAGNOSIS — I10 Essential (primary) hypertension: Secondary | ICD-10-CM | POA: Diagnosis not present

## 2019-04-07 NOTE — Progress Notes (Signed)
[facility] [place], [place] [address]  Pulmonary Sleep Medicine   Office Visit Note  Patient Name: Lawrence Yu DOB: 1970-08-07 MRN [id number]  Date of Service: 04/07/2019  Complaints/HPI: Pt is here for follow up on OSA. He cleans his machine by hand. He is changing his filters and tubing. He denies any side effects or issues at this time. He is using his machine nightly, and is very happy with his results.      ROS  General: (-) fever, (-) chills, (-) night sweats, (-) weakness Skin: (-) rashes, (-) itching,. Eyes: (-) visual changes, (-) redness, (-) itching. Nose and Sinuses: (-) nasal stuffiness or itchiness, (-) postnasal drip, (-) nosebleeds, (-) sinus trouble. Mouth and Throat: (-) sore throat, (-) hoarseness. Neck: (-) swollen glands, (-) enlarged thyroid, (-) neck pain. Respiratory: - cough, (-) bloody sputum, - shortness of breath, - wheezing. Cardiovascular: - ankle swelling, (-) chest pain. Lymphatic: (-) lymph node enlargement. Neurologic: (-) numbness, (-) tingling. Psychiatric: (-) anxiety, (-) depression   Current Medication: Outpatient Encounter Medications as of 04/07/2019  Medication Sig  . Canagliflozin-metFORMIN HCl (INVOKAMET) 50-1000 MG TABS Take 1 tablet by mouth 2 (two) times daily. Take 1 tab po 2 times daily  . Choline Fenofibrate (FENOFIBRIC ACID) 135 MG CPDR Take 1 tablet by mouth daily. Take 1 tab po daily  . Icosapent Ethyl (VASCEPA) 1 g CAPS Take 2 capsules (2 g total) by mouth 2 (two) times a day.  Marland Kitchen LIFESCAN FINEPOINT LANCETS MISC Use to check blood sugar 2 time(s) daily.  Marland Kitchen lisinopril (ZESTRIL) 5 MG tablet Take 1 tablet (5 mg total) by mouth daily.  . Misc. Devices MISC by Does not apply route. cpap device  . omeprazole (PRILOSEC) 20 MG capsule Take 1 capsule (20 mg total) by mouth daily.  . sildenafil (REVATIO) 20 MG tablet Take 1 tablet (20 mg total) by mouth daily as needed. Take 2 tab daily as needed   No  facility-administered encounter medications on file as of 04/07/2019.     Surgical History: Past Surgical History:  Procedure Laterality Date  . APPENDECTOMY      Medical History: Past Medical History:  Diagnosis Date  . Diabetes mellitus without complication ([place])    pre diabetic  . GERD (gastroesophageal reflux disease)   . Hypertension     Family History: History reviewed. No pertinent family history.  Social History: Social History   Socioeconomic History  . Marital status: Married    Spouse name: Not on file  . Number of children: Not on file  . Years of education: Not on file  . Highest education level: Not on file  Occupational History  . Not on file  Social Needs  . Financial resource strain: Not on file  . Food insecurity    Worry: Not on file    Inability: Not on file  . Transportation needs    Medical: Not on file    Non-medical: Not on file  Tobacco Use  . Smoking status: Never Smoker  . Smokeless tobacco: Never Used  Substance and Sexual Activity  . Alcohol use: No  . Drug use: No  . Sexual activity: Not on file  Lifestyle  . Physical activity    Days per week: Not on file    Minutes per session: Not on file  . Stress: Not on file  Relationships  . Social Herbalist on phone: Not on file    Gets  together: Not on file    Attends religious service: Not on file    Active member of club or organization: Not on file    Attends meetings of clubs or organizations: Not on file    Relationship status: Not on file  . Intimate partner violence    Fear of current or ex partner: Not on file    Emotionally abused: Not on file    Physically abused: Not on file    Forced sexual activity: Not on file  Other Topics Concern  . Not on file  Social History Narrative  . Not on file    Vital Signs: Blood pressure 140/82, pulse 90, resp. rate 16, height 5\' 11"  (1.803 m), weight 275 lb (124.7 kg), SpO2 99 %.  Examination: General Appearance: The  patient is well-developed, well-nourished, and in no distress. Skin: Gross inspection of skin unremarkable. Head: normocephalic, no gross deformities. Eyes: no gross deformities noted. ENT: ears appear grossly normal no exudates. Neck: Supple. No thyromegaly. No LAD. Respiratory: clear bilateraly. Cardiovascular: Normal S1 and S2 without murmur or rub. Extremities: No cyanosis. pulses are equal. Neurologic: Alert and oriented. No involuntary movements.  LABS: No results found for this or any previous visit (from the past 2160 hour(s)).  Radiology: Dg Knee Complete 4 Views Left  Result Date: 08/04/2016 CLINICAL DATA:  Pain and swelling along the medial left knee. EXAM: LEFT KNEE - COMPLETE 4+ VIEW COMPARISON:  Two MRI from 10/15/2007, 04/19/2007 radiographs of the left knee. 2 G FINDINGS: Tubular soft tissue densities are again seen along the medial aspect of the calf consistent with varicose veins. Joint spaces are maintained. No acute fracture or malalignment. Ossific density adjacent to the supracondylar aspect of the medial femoral condyle was present in 2008 and may reflect an old remote MCL injury. IMPRESSION: No acute osseous abnormality noted radiographically. 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 Soft tissue ossification along the course of the proximal MCL may reflect an old remote injury is again noted. Electronically Signed   By: Ashley Royalty M.D.   On: 08/04/2016 18:48    No results found.  No results found.    Assessment and Plan: Patient Active Problem List   Diagnosis Date Noted  . Hypertension 05/26/2017  . Sleep apnea 05/26/2017  . Type 2 diabetes mellitus with other diabetic kidney complication ([place]) 0000000  . Dyslipidemia with low high density lipoprotein (HDL) cholesterol with hypertriglyceridemia due to type 2 diabetes mellitus ([place]) 05/06/2017    1. OSA on CPAP Continue to use CPAP as directed.  He reports excellent sleep, and resolution of symptoms.   2.  Hypertension, unspecified type Stable, continue current management.   3. Class 2 severe obesity due to excess calories with serious comorbidity and body mass index (BMI) of 38.0 to 38.9 in adult [facility]) Obesity Counseling: Risk Assessment: An assessment of behavioral risk factors was made today and includes lack of exercise sedentary lifestyle, lack of portion control and poor dietary habits.  Risk Modification Advice: She was counseled on portion control guidelines. Restricting daily caloric intake to. . The detrimental long term effects of obesity on her health and ongoing poor compliance was also discussed with the patient.    General Counseling: I have discussed the findings of the evaluation and examination with Lawrence Yu.  I have also discussed any further diagnostic evaluation thatmay be needed or ordered today. Lawrence Yu verbalizes understanding of the findings of todays visit. We also reviewed his medications today and discussed drug interactions and  side effects including but not limited excessive drowsiness and altered mental states. We also discussed that there is always a risk not just to him but also people around him. he has been encouraged to call the office with any questions or concerns that should arise related to todays visit.    Time spent: 15 This patient was seen by Orson Gear AGNP-C in Collaboration with Dr. Devona Konig as a part of collaborative care agreement.   I have personally obtained a history, examined the patient, evaluated laboratory and imaging results, formulated the assessment and plan and placed orders.    Allyne Gee, MD [facility] Pulmonary and Critical Care Sleep medicine

## 2019-04-25 ENCOUNTER — Ambulatory Visit: Payer: PRIVATE HEALTH INSURANCE | Admitting: Adult Health

## 2019-06-08 ENCOUNTER — Other Ambulatory Visit: Payer: Self-pay | Admitting: Adult Health

## 2019-08-02 ENCOUNTER — Other Ambulatory Visit: Payer: Self-pay | Admitting: Internal Medicine

## 2019-08-02 ENCOUNTER — Telehealth: Payer: Self-pay

## 2019-08-02 MED ORDER — SYNJARDY 5-1000 MG PO TABS: 1.0000 | ORAL_TABLET | Freq: Two times a day (BID) | ORAL | 0 refills | Status: DC

## 2019-08-02 NOTE — Progress Notes (Signed)
New RX called in

## 2019-08-02 NOTE — Telephone Encounter (Signed)
PRIOR AUTHORIZATION FOR [facility] HAS BEEN APPROVED UNTIL 08/01/2020.TAT

## 2019-08-02 NOTE — Telephone Encounter (Signed)
I chnaged to synjardy but he does not have a recent hg a1c so I called in a low dose

## 2019-08-02 NOTE — Telephone Encounter (Signed)
Patient has been advised and to keep next appt so we can check a1c. beth

## 2019-09-01 ENCOUNTER — Telehealth: Payer: Self-pay

## 2019-09-01 NOTE — Telephone Encounter (Signed)
Called lmom informing patient of appointment on 09/05/2019. klh

## 2019-09-05 ENCOUNTER — Ambulatory Visit: Payer: Self-pay | Admitting: Adult Health

## 2019-09-15 ENCOUNTER — Telehealth: Payer: Self-pay

## 2019-09-15 NOTE — Telephone Encounter (Signed)
Called lmom informing patient of appointment on 09/20/2019. klh

## 2019-09-19 ENCOUNTER — Telehealth: Payer: Self-pay

## 2019-09-19 NOTE — Telephone Encounter (Signed)
CONFIRMED AND SCREENED FOR 09-20-19 OV.

## 2019-09-20 ENCOUNTER — Ambulatory Visit: Payer: PRIVATE HEALTH INSURANCE | Admitting: Adult Health

## 2019-09-20 ENCOUNTER — Other Ambulatory Visit: Payer: Self-pay

## 2019-09-20 ENCOUNTER — Encounter: Payer: Self-pay | Admitting: Adult Health

## 2019-09-20 VITALS — BP 127/78 | HR 103 | Temp 97.6°F | Resp 16 | Ht 71.0 in | Wt 281.0 lb

## 2019-09-20 DIAGNOSIS — Z9989 Dependence on other enabling machines and devices: Secondary | ICD-10-CM

## 2019-09-20 DIAGNOSIS — N529 Male erectile dysfunction, unspecified: Secondary | ICD-10-CM | POA: Diagnosis not present

## 2019-09-20 DIAGNOSIS — G4733 Obstructive sleep apnea (adult) (pediatric): Secondary | ICD-10-CM | POA: Diagnosis not present

## 2019-09-20 DIAGNOSIS — E1165 Type 2 diabetes mellitus with hyperglycemia: Secondary | ICD-10-CM | POA: Diagnosis not present

## 2019-09-20 DIAGNOSIS — I10 Essential (primary) hypertension: Secondary | ICD-10-CM | POA: Diagnosis not present

## 2019-09-20 LAB — POCT GLYCOSYLATED HEMOGLOBIN (HGB A1C): Hemoglobin A1C: 8.3 % — AB (ref 4.0–5.6)

## 2019-09-20 MED ORDER — TRULICITY 1.5 MG/0.5ML ~~LOC~~ SOAJ: 1.5000 mg | SUBCUTANEOUS | 2 refills | Status: DC

## 2019-09-20 NOTE — Progress Notes (Signed)
[facility] [place], [place] [address]  Internal MEDICINE  Office Visit Note  Patient Name: Lawrence Yu  [id number]  [id number]  Date of Service: 09/20/2019  Chief Complaint  Patient presents with  . Follow-up  . Diabetes  . Gastroesophageal Reflux  . Hypertension    HPI  PT seen today for follow up on DM, GERD and Hypertension.  Overall he is doing well.  Denies any complaints at this time. His A1c is up today to 8.3 from 7.4 at last check.  He reports while he has been quarantined at home from his son having covid he has been eating a lot of things he shouldn't. He has gone back to work, and is getting back on his regimen to get his sugar back under control.  When he checks his morning sugar it is between 130-160 normally.  His GERD is well controlled and he does not have any issues with his bp.  Denies Chest pain, Shortness of breath, palpitations, headache, or blurred vision.    Current Medication: Outpatient Encounter Medications as of 09/20/2019  Medication Sig  . Choline Fenofibrate (FENOFIBRIC ACID) 135 MG CPDR Take 1 tablet by mouth daily. Take 1 tab po daily  . Empagliflozin-metFORMIN HCl (SYNJARDY) 11-998 MG TABS Take 1 tablet by mouth 2 (two) times daily.  Vanessa Kick Ethyl (VASCEPA) 1 g CAPS Take 2 capsules (2 g total) by mouth 2 (two) times a day.  Marland Kitchen LIFESCAN FINEPOINT LANCETS MISC Use to check blood sugar 2 time(s) daily.  Marland Kitchen lisinopril (ZESTRIL) 5 MG tablet Take 1 tablet (5 mg total) by mouth daily.  . Misc. Devices MISC by Does not apply route. cpap device  . omeprazole (PRILOSEC) 20 MG capsule TAKE 1 CAPSULE BY MOUTH  DAILY  . sildenafil (REVATIO) 20 MG tablet Take 1 tablet (20 mg total) by mouth daily as needed. Take 2 tab daily as needed   No facility-administered encounter medications on file as of 09/20/2019.    Surgical History: Past Surgical History:  Procedure Laterality Date  . APPENDECTOMY      Medical History: Past  Medical History:  Diagnosis Date  . Diabetes mellitus without complication ([place])    pre diabetic  . GERD (gastroesophageal reflux disease)   . Hypertension     Family History: History reviewed. No pertinent family history.  Social History   Socioeconomic History  . Marital status: Married    Spouse name: Not on file  . Number of children: Not on file  . Years of education: Not on file  . Highest education level: Not on file  Occupational History  . Not on file  Tobacco Use  . Smoking status: Never Smoker  . Smokeless tobacco: Never Used  Substance and Sexual Activity  . Alcohol use: No  . Drug use: No  . Sexual activity: Not on file  Other Topics Concern  . Not on file  Social History Narrative  . Not on file   Social Determinants of Health   Financial Resource Strain:   . Difficulty of Paying Living Expenses: Not on file  Food Insecurity:   . Worried About Charity fundraiser in the Last Year: Not on file  . Ran Out of Food in the Last Year: Not on file  Transportation Needs:   . Lack of Transportation (Medical): Not on file  . Lack of Transportation (Non-Medical): Not on file  Physical Activity:   . Days of Exercise per Week: Not on  file  . Minutes of Exercise per Session: Not on file  Stress:   . Feeling of Stress : Not on file  Social Connections:   . Frequency of Communication with Friends and Family: Not on file  . Frequency of Social Gatherings with Friends and Family: Not on file  . Attends Religious Services: Not on file  . Active Member of Clubs or Organizations: Not on file  . Attends Archivist Meetings: Not on file  . Marital Status: Not on file  Intimate Partner Violence:   . Fear of Current or Ex-Partner: Not on file  . Emotionally Abused: Not on file  . Physically Abused: Not on file  . Sexually Abused: Not on file      Review of Systems  Vital Signs: BP 127/78   Pulse (!) 103   Temp 97.6 F (36.4 C)   Resp 16   Ht 5'  11" (1.803 m)   Wt 281 lb (127.5 kg)   SpO2 96%   BMI 39.19 kg/m    Physical Exam Vitals and nursing note reviewed.  Constitutional:      General: He is not in acute distress.    Appearance: He is well-developed. He is not diaphoretic.  HENT:     Head: Normocephalic and atraumatic.     Mouth/Throat:     Pharynx: No oropharyngeal exudate.  Eyes:     Pupils: Pupils are equal, round, and reactive to light.  Neck:     Thyroid: No thyromegaly.     Vascular: No JVD.     Trachea: No tracheal deviation.  Cardiovascular:     Rate and Rhythm: Normal rate and regular rhythm.     Heart sounds: Normal heart sounds. No murmur. No friction rub. No gallop.   Pulmonary:     Effort: Pulmonary effort is normal. No respiratory distress.     Breath sounds: Normal breath sounds. No wheezing or rales.  Chest:     Chest wall: No tenderness.  Abdominal:     Palpations: Abdomen is soft.     Tenderness: There is no abdominal tenderness. There is no guarding.  Musculoskeletal:        General: Normal range of motion.     Cervical back: Normal range of motion and neck supple.  Lymphadenopathy:     Cervical: No cervical adenopathy.  Skin:    General: Skin is warm and dry.  Neurological:     Mental Status: He is alert and oriented to person, place, and time.     Cranial Nerves: No cranial nerve deficit.  Psychiatric:        Behavior: Behavior normal.        Thought Content: Thought content normal.        Judgment: Judgment normal.     Assessment/Plan: 1. Uncontrolled type 2 diabetes mellitus with hyperglycemia ([facility]) Continue synjardy.  Start trulicity, samples given.  - POCT HgB A1C - Dulaglutide (TRULICITY) 1.5 0000000 SOPN; Inject 1.5 mg into the skin once a week.  Dispense: 4 pen; Refill: 2  2. OSA on CPAP Continue to use cpap a night as directed.   3. Hypertension, unspecified type Stable, continue present mgmt.  4. Erectile dysfunction, unspecified erectile dysfunction type Good  results with sildenafil, continue as discussed.   General Counseling: Jerri verbalizes understanding of the findings of todays visit and agrees with plan of treatment. I have discussed any further diagnostic evaluation that may be needed or ordered today. We also reviewed his medications  today. he has been encouraged to call the office with any questions or concerns that should arise related to todays visit.    Orders Placed This Encounter  Procedures  . POCT HgB A1C    No orders of the defined types were placed in this encounter.   Time spent: 25 Minutes   This patient was seen by Orson Gear AGNP-C in Collaboration with Dr Lavera Guise as a part of collaborative care agreement     Kendell Bane AGNP-C Internal medicine

## 2019-09-24 ENCOUNTER — Other Ambulatory Visit: Payer: Self-pay | Admitting: Adult Health

## 2019-09-27 ENCOUNTER — Telehealth: Payer: Self-pay

## 2019-09-27 NOTE — Telephone Encounter (Signed)
MEDICAL RECORDS REQUEST COMPLETED AND MAILED TO [facility]  [employer].

## 2019-10-04 ENCOUNTER — Other Ambulatory Visit: Payer: Self-pay | Admitting: Internal Medicine

## 2019-10-06 ENCOUNTER — Telehealth: Payer: Self-pay

## 2019-10-06 NOTE — Telephone Encounter (Signed)
Called lmom informing patient of appointment on 03/22/201. klh

## 2019-10-10 ENCOUNTER — Other Ambulatory Visit: Payer: Self-pay

## 2019-10-10 ENCOUNTER — Telehealth: Payer: Self-pay

## 2019-10-10 ENCOUNTER — Ambulatory Visit: Payer: PRIVATE HEALTH INSURANCE | Admitting: Internal Medicine

## 2019-10-10 ENCOUNTER — Encounter: Payer: Self-pay | Admitting: Internal Medicine

## 2019-10-10 VITALS — BP 134/79 | HR 95 | Temp 97.6°F | Resp 16 | Ht 71.0 in | Wt 279.0 lb

## 2019-10-10 DIAGNOSIS — G4733 Obstructive sleep apnea (adult) (pediatric): Secondary | ICD-10-CM | POA: Diagnosis not present

## 2019-10-10 DIAGNOSIS — Z9989 Dependence on other enabling machines and devices: Secondary | ICD-10-CM

## 2019-10-10 DIAGNOSIS — Z6838 Body mass index (BMI) 38.0-38.9, adult: Secondary | ICD-10-CM

## 2019-10-10 DIAGNOSIS — I10 Essential (primary) hypertension: Secondary | ICD-10-CM

## 2019-10-10 NOTE — Progress Notes (Signed)
[facility] [place], [place] [address]  Pulmonary Sleep Medicine   Office Visit Note  Patient Name: Lawrence Yu DOB: [birth year] MRN [id number]  Date of Service: 10/10/2019   Complaints/HPI: Patient is here to follow-up on his OSA which he wears CPAP every night for. He reports nightly compliance, no complaints. Cleans his machine by hand and changes his tubing and filters as directed. He reports his GERD is well controlled at this time. BP is well controlled today. Denies shortness of breath, chest pain or palpitations.  ROS  General: (-) fever, (-) chills, (-) night sweats, (-) weakness Skin: (-) rashes, (-) itching,. Eyes: (-) visual changes, (-) redness, (-) itching. Nose and Sinuses: (-) nasal stuffiness or itchiness, (-) postnasal drip, (-) nosebleeds, (-) sinus trouble. Mouth and Throat: (-) sore throat, (-) hoarseness. Neck: (-) swollen glands, (-) enlarged thyroid, (-) neck pain. Respiratory: - cough, (-) bloody sputum, - shortness of breath, - wheezing. Cardiovascular: - ankle swelling, (-) chest pain. Lymphatic: (-) lymph node enlargement. Neurologic: (-) numbness, (-) tingling. Psychiatric: (-) anxiety, (-) depression   Current Medication: Outpatient Encounter Medications as of 10/10/2019  Medication Sig  . Choline Fenofibrate (FENOFIBRIC ACID) 135 MG CPDR Take 1 tablet by mouth daily. Take 1 tab po daily  . Dulaglutide (TRULICITY) 1.5 0000000 SOPN Inject 1.5 mg into the skin once a week.  Vanessa Kick Ethyl (VASCEPA) 1 g CAPS Take 2 capsules (2 g total) by mouth 2 (two) times a day.  Marland Kitchen LIFESCAN FINEPOINT LANCETS MISC Use to check blood sugar 2 time(s) daily.  Marland Kitchen lisinopril (ZESTRIL) 5 MG tablet TAKE 1 TABLET BY MOUTH  DAILY  . Misc. Devices MISC by Does not apply route. cpap device  . omeprazole (PRILOSEC) 20 MG capsule TAKE 1 CAPSULE BY MOUTH  DAILY  . sildenafil (REVATIO) 20 MG tablet Take 1 tablet (20 mg total) by mouth daily as  needed. Take 2 tab daily as needed  . SYNJARDY 11-998 MG TABS TAKE 1 TABLET BY MOUTH TWICE DAILY   No facility-administered encounter medications on file as of 10/10/2019.    Surgical History: Past Surgical History:  Procedure Laterality Date  . APPENDECTOMY      Medical History: Past Medical History:  Diagnosis Date  . Diabetes mellitus without complication ([place])    pre diabetic  . GERD (gastroesophageal reflux disease)   . Hypertension     Family History: History reviewed. No pertinent family history.  Social History: Social History   Socioeconomic History  . Marital status: Married    Spouse name: Not on file  . Number of children: Not on file  . Years of education: Not on file  . Highest education level: Not on file  Occupational History  . Not on file  Tobacco Use  . Smoking status: Never Smoker  . Smokeless tobacco: Never Used  Substance and Sexual Activity  . Alcohol use: No  . Drug use: No  . Sexual activity: Not on file  Other Topics Concern  . Not on file  Social History Narrative  . Not on file   Social Determinants of Health   Financial Resource Strain:   . Difficulty of Paying Living Expenses:   Food Insecurity:   . Worried About Charity fundraiser in the Last Year:   . Arboriculturist in the Last Year:   Transportation Needs:   . Film/video editor (Medical):   Marland Kitchen Lack of Transportation (Non-Medical):   Physical  Activity:   . Days of Exercise per Week:   . Minutes of Exercise per Session:   Stress:   . Feeling of Stress :   Social Connections:   . Frequency of Communication with Friends and Family:   . Frequency of Social Gatherings with Friends and Family:   . Attends Religious Services:   . Active Member of Clubs or Organizations:   . Attends Archivist Meetings:   Marland Kitchen Marital Status:   Intimate Partner Violence:   . Fear of Current or Ex-Partner:   . Emotionally Abused:   Marland Kitchen Physically Abused:   . Sexually Abused:      Vital Signs: Blood pressure 134/79, pulse 95, temperature 97.6 F (36.4 C), resp. rate 16, height 5\' 11"  (1.803 m), weight 279 lb (126.6 kg), SpO2 96 %.  Examination: General Appearance: The patient is well-developed, well-nourished, and in no distress. Skin: Gross inspection of skin unremarkable. Head: normocephalic, no gross deformities. Eyes: no gross deformities noted. ENT: ears appear grossly normal no exudates. Neck: Supple. No thyromegaly. No LAD. Respiratory: Clear lung sounds bilaterally.. Cardiovascular: Normal S1 and S2 without murmur or rub. Extremities: No cyanosis. pulses are equal. Neurologic: Alert and oriented. No involuntary movements.  LABS: Recent Results (from the past 2160 hour(s))  POCT HgB A1C     Status: Abnormal   Collection Time: 09/20/19  3:08 PM  Result Value Ref Range   Hemoglobin A1C 8.3 (A) 4.0 - 5.6 %   HbA1c POC (<> result, manual entry)     HbA1c, POC (prediabetic range)     HbA1c, POC (controlled diabetic range)      Radiology: DG Knee Complete 4 Views Left  Result Date: 08/04/2016 CLINICAL DATA:  Pain and swelling along the medial left knee. EXAM: LEFT KNEE - COMPLETE 4+ VIEW COMPARISON:  Two MRI from 10/15/2007, 04/19/2007 radiographs of the left knee. 2 G FINDINGS: Tubular soft tissue densities are again seen along the medial aspect of the calf consistent with varicose veins. Joint spaces are maintained. No acute fracture or malalignment. Ossific density adjacent to the supracondylar aspect of the medial femoral condyle was present in 2008 and may reflect an old remote MCL injury. IMPRESSION: No acute osseous abnormality noted radiographically. 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 Soft tissue ossification along the course of the proximal MCL may reflect an old remote injury is again noted. Electronically Signed   By: Ashley Royalty M.D.   On: 08/04/2016 18:48    No results found.  No results found.    Assessment and Plan: Patient Active  Problem List   Diagnosis Date Noted  . Hypertension 05/26/2017  . Sleep apnea 05/26/2017  . Type 2 diabetes mellitus with other diabetic kidney complication ([place]) 0000000  . 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 OSA on CPAP Reports nightly compliance with his CPAP. Denies headache or daytime drowsiness. Continue to monitor.  2. Hypertension, unspecified type Well controlled on current therapy, continue to monitor.  3. Class 2 severe obesity due to excess calories with serious comorbidity and body mass index (BMI) of 38.0 to 38.9 in adult [facility]) Comorbidities consist of HTN as well as OSA. Obesity Counseling: Risk Assessment: An assessment of behavioral risk factors was made today and includes lack of exercise sedentary lifestyle, lack of portion control and poor dietary habits.  Risk Modification Advice: She was counseled on portion control guidelines. Restricting daily caloric  intake to 1800. The detrimental long term effects of obesity on her health and ongoing poor compliance was also discussed with the patient.     General Counseling: I have discussed the findings of the evaluation and examination with Lawrence Yu.  I have also discussed any further diagnostic evaluation thatmay be needed or ordered today. Lawrence Yu verbalizes understanding of the findings of todays visit. We also reviewed his medications today and discussed drug interactions and side effects including but not limited excessive drowsiness and altered mental states. We also discussed that there is always a risk not just to him but also people around him. he has been encouraged to call the office with any questions or concerns that should arise related to todays visit.  No orders of the defined types were placed in this encounter.    Time spent: 30 This patient was seen by Orson Gear AGNP-C in Collaboration with Dr.  Devona Konig as a part of collaborative care agreement.  I have personally obtained a history, examined the patient, evaluated laboratory and imaging results, formulated the assessment and plan and placed orders.    Allyne Gee, MD [facility] Pulmonary and Critical Care Sleep medicine

## 2019-10-10 NOTE — Telephone Encounter (Signed)
Confirmed appointment on 10/10/2019 and screened for covid. klh 

## 2019-11-17 ENCOUNTER — Other Ambulatory Visit: Payer: Self-pay | Admitting: Adult Health

## 2019-11-17 DIAGNOSIS — E781 Pure hyperglyceridemia: Secondary | ICD-10-CM

## 2019-12-16 ENCOUNTER — Telehealth: Payer: Self-pay

## 2019-12-16 NOTE — Telephone Encounter (Signed)
Called lmom informing patient of appointment on 12/21/2019. klh 

## 2019-12-21 ENCOUNTER — Ambulatory Visit: Payer: PRIVATE HEALTH INSURANCE | Admitting: Adult Health

## 2020-01-10 ENCOUNTER — Other Ambulatory Visit: Payer: Self-pay | Admitting: Adult Health

## 2020-01-10 DIAGNOSIS — E781 Pure hyperglyceridemia: Secondary | ICD-10-CM

## 2020-01-10 NOTE — Telephone Encounter (Signed)
I scheduled patient for CPE on July 20. Patient Advised.

## 2020-01-10 NOTE — Telephone Encounter (Signed)
Pt will need to keep his app for future refills

## 2020-02-02 ENCOUNTER — Telehealth: Payer: Self-pay

## 2020-02-02 NOTE — Telephone Encounter (Signed)
Lmom to confirm and screen for 02-07-20 ov. 

## 2020-02-07 ENCOUNTER — Ambulatory Visit (INDEPENDENT_AMBULATORY_CARE_PROVIDER_SITE_OTHER): Payer: PRIVATE HEALTH INSURANCE | Admitting: Nurse Practitioner

## 2020-02-07 ENCOUNTER — Other Ambulatory Visit: Payer: Self-pay

## 2020-02-07 ENCOUNTER — Encounter: Payer: Self-pay | Admitting: Nurse Practitioner

## 2020-02-07 VITALS — BP 143/82 | HR 89 | Temp 98.0°F | Resp 16 | Ht 71.0 in | Wt 274.8 lb

## 2020-02-07 DIAGNOSIS — Z9189 Other specified personal risk factors, not elsewhere classified: Secondary | ICD-10-CM

## 2020-02-07 DIAGNOSIS — I10 Essential (primary) hypertension: Secondary | ICD-10-CM | POA: Diagnosis not present

## 2020-02-07 DIAGNOSIS — E1165 Type 2 diabetes mellitus with hyperglycemia: Secondary | ICD-10-CM | POA: Diagnosis not present

## 2020-02-07 DIAGNOSIS — E782 Mixed hyperlipidemia: Secondary | ICD-10-CM

## 2020-02-07 DIAGNOSIS — Z0001 Encounter for general adult medical examination with abnormal findings: Secondary | ICD-10-CM

## 2020-02-07 DIAGNOSIS — R300 Dysuria: Secondary | ICD-10-CM

## 2020-02-07 DIAGNOSIS — E1169 Type 2 diabetes mellitus with other specified complication: Secondary | ICD-10-CM

## 2020-02-07 LAB — POCT GLYCOSYLATED HEMOGLOBIN (HGB A1C): Hemoglobin A1C: 7.3 % — AB (ref 4.0–5.6)

## 2020-02-07 NOTE — Progress Notes (Signed)
[facility] [place], [place] [address]  Internal MEDICINE  Office Visit Note  Patient Name: Lawrence Yu  [id number]  [id number]  Date of Service: 03/04/2020   Pt is here for routine health maintenance examination  Chief Complaint  Patient presents with  . Annual Exam  . Diabetes  . Gastroesophageal Reflux  . Hypertension  . Quality Metric Gaps    Foot exam, Eye exam, TDA and Pneumococcal      The patient is here for annual wellness visit. His blood sugars have improved. Today, his HgbA1c is 7.3, down from 8.3 at his most recent check. He is due to have diabetic eye exam. He is due to have routine, fasting labs. His blood pressure is well managed. He has no physical concerns or complaints today.     Current Medication: Outpatient Encounter Medications as of 02/07/2020  Medication Sig  . Choline Fenofibrate (FENOFIBRIC ACID) 135 MG CPDR TAKE 1 TABLET BY MOUTH  DAILY.  . Dulaglutide (TRULICITY) 1.5 IW/5.8KD SOPN Inject 1.5 mg into the skin once a week.  Marland Kitchen LIFESCAN FINEPOINT LANCETS MISC Use to check blood sugar 2 time(s) daily.  Marland Kitchen lisinopril (ZESTRIL) 5 MG tablet TAKE 1 TABLET BY MOUTH  DAILY  . Misc. Devices MISC by Does not apply route. cpap device  . omeprazole (PRILOSEC) 20 MG capsule TAKE 1 CAPSULE BY MOUTH  DAILY  . sildenafil (REVATIO) 20 MG tablet Take 1 tablet (20 mg total) by mouth daily as needed. Take 2 tab daily as needed  . SYNJARDY 11-998 MG TABS TAKE 1 TABLET BY MOUTH TWICE DAILY  . VASCEPA 1 g capsule TAKE 2 CAPSULES BY MOUTH  TWICE DAILY   No facility-administered encounter medications on file as of 02/07/2020.    Surgical History: Past Surgical History:  Procedure Laterality Date  . APPENDECTOMY      Medical History: Past Medical History:  Diagnosis Date  . Diabetes mellitus without complication ([place])    pre diabetic  . GERD (gastroesophageal reflux disease)   . Hypertension     Family History: History reviewed.  No pertinent family history.    Review of Systems  Constitutional: Negative for activity change, chills, fatigue and unexpected weight change.  HENT: Negative for congestion, postnasal drip, rhinorrhea, sneezing and sore throat.   Respiratory: Negative for cough, chest tightness, shortness of breath and wheezing.   Cardiovascular: Negative for chest pain and palpitations.       Blood pressure is well managed   Gastrointestinal: Negative for abdominal pain, constipation, diarrhea, nausea and vomiting.  Endocrine: Negative for cold intolerance, heat intolerance, polydipsia and polyuria.       Improved blood sugars.   Genitourinary: Negative for dysuria, frequency, hematuria, testicular pain and urgency.  Musculoskeletal: Negative for arthralgias, back pain, joint swelling and neck pain.  Skin: Negative for rash.  Allergic/Immunologic: Negative for environmental allergies.  Neurological: Negative for dizziness, tremors, numbness and headaches.  Hematological: Negative for adenopathy. Does not bruise/bleed easily.  Psychiatric/Behavioral: Negative for behavioral problems (Depression), sleep disturbance and suicidal ideas. The patient is not nervous/anxious.      Today's Vitals   02/07/20 1115  BP: (!) 143/82  Pulse: 89  Resp: 16  Temp: 98 F (36.7 C)  SpO2: 97%  Weight: 274 lb 12.8 oz (124.6 kg)  Height: 5\' 11"  (1.803 m)   Body mass index is 38.33 kg/m.  Physical Exam Vitals and nursing note reviewed.  Constitutional:      General: He is  not in acute distress.    Appearance: Normal appearance. He is well-developed. He is obese. He is not diaphoretic.  HENT:     Head: Normocephalic and atraumatic.     Nose: Nose normal.     Mouth/Throat:     Pharynx: No oropharyngeal exudate.  Eyes:     Pupils: Pupils are equal, round, and reactive to light.  Neck:     Thyroid: No thyromegaly.     Vascular: No carotid bruit or JVD.     Trachea: No tracheal deviation.  Cardiovascular:      Rate and Rhythm: Normal rate and regular rhythm.     Pulses: Normal pulses.     Heart sounds: Normal heart sounds. No murmur heard.  No friction rub. No gallop.   Pulmonary:     Effort: Pulmonary effort is normal. No respiratory distress.     Breath sounds: Normal breath sounds. No wheezing or rales.  Chest:     Chest wall: No tenderness.  Abdominal:     General: Bowel sounds are normal.     Palpations: Abdomen is soft.     Tenderness: There is no abdominal tenderness.  Musculoskeletal:        General: Normal range of motion.     Cervical back: Normal range of motion and neck supple.  Lymphadenopathy:     Cervical: No cervical adenopathy.  Skin:    General: Skin is warm and dry.  Neurological:     Mental Status: He is alert and oriented to person, place, and time.     Cranial Nerves: No cranial nerve deficit.  Psychiatric:        Mood and Affect: Mood normal.        Behavior: Behavior normal.        Thought Content: Thought content normal.        Judgment: Judgment normal.    LABS: Recent Results (from the past 2160 hour(s))  POCT HgB A1C     Status: Abnormal   Collection Time: 02/07/20 12:08 PM  Result Value Ref Range   Hemoglobin A1C 7.3 (A) 4.0 - 5.6 %   HbA1c POC (<> result, manual entry)     HbA1c, POC (prediabetic range)     HbA1c, POC (controlled diabetic range)    UA/M w/rflx Culture, Routine     Status: Abnormal   Collection Time: 02/07/20  4:14 PM   Specimen: Urine   Urine  Result Value Ref Range   Specific Gravity, UA      >=1.030 (A) 1.005 - 1.030   pH, UA 5.5 5.0 - 7.5   Color, UA Yellow Yellow   Appearance Ur Clear Clear   Leukocytes,UA Negative Negative   Protein,UA Negative Negative/Trace   Glucose, UA 3+ (A) Negative   Ketones, UA Negative Negative   RBC, UA Negative Negative   Bilirubin, UA Negative Negative   Urobilinogen, Ur 0.2 0.2 - 1.0 mg/dL   Nitrite, UA Negative Negative   Microscopic Examination Comment     Comment: Microscopic  follows if indicated.   Microscopic Examination See below:     Comment: Microscopic was indicated and was performed.   Urinalysis Reflex Comment     Comment: This specimen will not reflex to a Urine Culture.  Microscopic Examination     Status: None   Collection Time: 02/07/20  4:14 PM   Urine  Result Value Ref Range   WBC, UA None seen 0 - 5 /hpf   RBC None seen 0 -  2 /hpf   Epithelial Cells (non renal) None seen 0 - 10 /hpf   Casts None seen None seen /lpf   Bacteria, UA None seen None seen/Few    .Assessment/Plan: 1. Encounter for general adult medical examination with abnormal findings Annual health maintenance exam today. Order slip given to have routine, fasting labs done.   2. Type 2 diabetes mellitus with hyperglycemia, without long-term current use of insulin (HCC) - POCT HgB A1C 7.3 today. Continue diabetic medications as prescribed. Refer for diabetic eye exam.  - Ambulatory referral to Ophthalmology  3. Hypertension, unspecified type Stable. Continue bp medication as prescribed.   4. Dyslipidemia with low high density lipoprotein (HDL) cholesterol with hypertriglyceridemia due to type 2 diabetes mellitus ([place]) Check routine, fasting labs. Adjust medication as indicated.   5. At increased risk of exposure to COVID-19 virus Check COVID 19 antibodies with routine labs.   6. Dysuria - UA/M w/rflx Culture, Routine  General Counseling: [place] verbalizes understanding of the findings of todays visit and agrees with plan of treatment. I have discussed any further diagnostic evaluation that may be needed or ordered today. We also reviewed his medications today. he has been encouraged to call the office with any questions or concerns that should arise related to todays visit.    Counseling:  Diabetes Counseling:  1. Addition of ACE inh/ ARB'S for nephroprotection. Microalbumin is updated  2. Diabetic foot care, prevention of complications. Podiatry consult 3. Exercise and  lose weight.  4. Diabetic eye examination, Diabetic eye exam is updated  5. Monitor blood sugar closlely. nutrition counseling.  6. Sign and symptoms of hypoglycemia including shaking sweating,confusion and headaches.   This patient was seen by Leretha Pol FNP Collaboration with Dr Lavera Guise as a part of collaborative care agreement  Orders Placed This Encounter  Procedures  . Microscopic Examination  . UA/M w/rflx Culture, Routine  . Ambulatory referral to Ophthalmology  . POCT HgB A1C      Total time spent: 45 Minutes  Time spent includes review of chart, medications, test results, and follow up plan with the patient.     Lavera Guise, MD  Internal Medicine

## 2020-02-08 LAB — UA/M W/RFLX CULTURE, ROUTINE
Bilirubin, UA: NEGATIVE
Ketones, UA: NEGATIVE
Leukocytes,UA: NEGATIVE
Nitrite, UA: NEGATIVE
Protein,UA: NEGATIVE
RBC, UA: NEGATIVE
Specific Gravity, UA: >=1.03 — AB (ref 1.005–1.030)
Urobilinogen, Ur: 0.2 mg/dL (ref 0.2–1.0)
pH, UA: 5.5 (ref 5.0–7.5)

## 2020-02-08 LAB — MICROSCOPIC EXAMINATION
Bacteria, UA: NONE SEEN
Casts: NONE SEEN /lpf
Epithelial Cells (non renal): NONE SEEN /hpf (ref 0–10)
RBC, Urine: NONE SEEN /hpf (ref 0–2)
WBC, UA: NONE SEEN /hpf (ref 0–5)

## 2020-02-08 NOTE — Progress Notes (Signed)
Glucose expected in urine due to medications.

## 2020-02-21 ENCOUNTER — Other Ambulatory Visit: Payer: Self-pay

## 2020-02-21 ENCOUNTER — Encounter: Payer: Self-pay | Admitting: Adult Health

## 2020-02-21 ENCOUNTER — Ambulatory Visit: Payer: PRIVATE HEALTH INSURANCE | Admitting: Adult Health

## 2020-02-21 VITALS — BP 144/87 | HR 101 | Temp 97.9°F | Resp 16 | Ht 71.0 in | Wt 270.2 lb

## 2020-02-21 DIAGNOSIS — L089 Local infection of the skin and subcutaneous tissue, unspecified: Secondary | ICD-10-CM | POA: Diagnosis not present

## 2020-02-21 DIAGNOSIS — E1165 Type 2 diabetes mellitus with hyperglycemia: Secondary | ICD-10-CM

## 2020-02-21 MED ORDER — SULFAMETHOXAZOLE-TRIMETHOPRIM 400-80 MG PO TABS: 1.0000 | ORAL_TABLET | Freq: Two times a day (BID) | ORAL | 0 refills | Status: DC

## 2020-02-21 NOTE — Progress Notes (Signed)
Lawrence Yu [place] Yu, [place] [address]  Internal MEDICINE  Office Visit Note  Patient Name: Lawrence Yu  [id number]  [id number]  Date of Service: 02/21/2020  Chief Complaint  Patient presents with  . Toe Pain    Left, big toe pain; throbbing pain - started at noon yesterday   . Quality Metric Gaps    TDAP     HPI Pt is here for a sick visit. He reports he was wearing crocs two weeks ago and stumbled and his great toe nail lifted up. He now has some redness, and warmth.  With his diabetes, he does not want it to get out of control.  Denies any fever or drainage.      Current Medication:  Outpatient Encounter Medications as of 02/21/2020  Medication Sig  . Choline Fenofibrate (FENOFIBRIC ACID) 135 MG CPDR TAKE 1 TABLET BY MOUTH  DAILY.  . Dulaglutide (TRULICITY) 1.5 GN/5.6OZ SOPN Inject 1.5 mg into the skin once a week.  Marland Kitchen LIFESCAN FINEPOINT LANCETS MISC Use to check blood sugar 2 time(s) daily.  Marland Kitchen lisinopril (ZESTRIL) 5 MG tablet TAKE 1 TABLET BY MOUTH  DAILY  . Misc. Devices MISC by Does not apply route. cpap device  . omeprazole (PRILOSEC) 20 MG capsule TAKE 1 CAPSULE BY MOUTH  DAILY  . sildenafil (REVATIO) 20 MG tablet Take 1 tablet (20 mg total) by mouth daily as needed. Take 2 tab daily as needed  . SYNJARDY 11-998 MG TABS TAKE 1 TABLET BY MOUTH TWICE DAILY  . VASCEPA 1 g capsule TAKE 2 CAPSULES BY MOUTH  TWICE DAILY  . sulfamethoxazole-trimethoprim (BACTRIM) 400-80 MG tablet Take 1 tablet by mouth 2 (two) times daily.   No facility-administered encounter medications on file as of 02/21/2020.      Medical History: Past Medical History:  Diagnosis Date  . Diabetes mellitus without complication ([place])    pre diabetic  . GERD (gastroesophageal reflux disease)   . Hypertension      Vital Signs: BP (!) 144/87   Pulse (!) 101   Temp 97.9 F (36.6 C)   Resp 16   Ht 5\' 11"  (1.803 m)   Wt 270 lb 3.2 oz (122.6 kg)   SpO2 96%   BMI  37.69 kg/m    Review of Systems  Constitutional: Negative.  Negative for chills, fatigue and unexpected weight change.  HENT: Negative.  Negative for congestion, rhinorrhea, sneezing and sore throat.   Eyes: Negative for redness.  Respiratory: Negative.  Negative for cough, chest tightness and shortness of breath.   Cardiovascular: Negative.  Negative for chest pain and palpitations.  Gastrointestinal: Negative.  Negative for abdominal pain, constipation, diarrhea, nausea and vomiting.  Endocrine: Negative.   Genitourinary: Negative.  Negative for dysuria and frequency.  Musculoskeletal: Negative.  Negative for arthralgias, back pain, joint swelling and neck pain.  Skin: Negative.  Negative for rash.  Allergic/Immunologic: Negative.   Neurological: Negative.  Negative for tremors and numbness.  Hematological: Negative for adenopathy. Does not bruise/bleed easily.  Psychiatric/Behavioral: Negative.  Negative for behavioral problems, sleep disturbance and suicidal ideas. The patient is not nervous/anxious.     Physical Exam Vitals and nursing note reviewed.  Constitutional:      General: He is not in acute distress.    Appearance: He is well-developed. He is not diaphoretic.  HENT:     Head: Normocephalic and atraumatic.     Mouth/Throat:     Pharynx: No oropharyngeal exudate.  Eyes:  Pupils: Pupils are equal, round, and reactive to light.  Neck:     Thyroid: No thyromegaly.     Vascular: No JVD.     Trachea: No tracheal deviation.  Cardiovascular:     Rate and Rhythm: Normal rate and regular rhythm.     Heart sounds: Normal heart sounds. No murmur heard.  No friction rub. No gallop.   Pulmonary:     Effort: Pulmonary effort is normal. No respiratory distress.     Breath sounds: Normal breath sounds. No wheezing or rales.  Chest:     Chest wall: No tenderness.  Abdominal:     Palpations: Abdomen is soft.     Tenderness: There is no abdominal tenderness. There is no  guarding.  Musculoskeletal:        General: Normal range of motion.     Cervical back: Normal range of motion and neck supple.  Lymphadenopathy:     Cervical: No cervical adenopathy.  Skin:    General: Skin is warm and dry.  Neurological:     Mental Status: He is alert and oriented to person, place, and time.     Cranial Nerves: No cranial nerve deficit.  Psychiatric:        Behavior: Behavior normal.        Thought Content: Thought content normal.        Judgment: Judgment normal.    Assessment/Plan: 1. Toe infection Advised patient to take entire course of antibiotics as prescribed with food. Pt should return to clinic in 7-10 days if symptoms fail to improve or new symptoms develop.  - sulfamethoxazole-trimethoprim (BACTRIM) 400-80 MG tablet; Take 1 tablet by mouth 2 (two) times daily.  Dispense: 20 tablet; Refill: 0  2. Type 2 diabetes mellitus with hyperglycemia, without long-term current use of insulin (HCC) Continue to monitor.  General Counseling: [place] verbalizes understanding of the findings of todays visit and agrees with plan of treatment. I have discussed any further diagnostic evaluation that may be needed or ordered today. We also reviewed his medications today. he has been encouraged to call the office with any questions or concerns that should arise related to todays visit.   No orders of the defined types were placed in this encounter.   Meds ordered this encounter  Medications  . sulfamethoxazole-trimethoprim (BACTRIM) 400-80 MG tablet    Sig: Take 1 tablet by mouth 2 (two) times daily.    Dispense:  20 tablet    Refill:  0    Time spent: 30 Minutes  This patient was seen by Orson Gear AGNP-C in Collaboration with Dr Lavera Guise as a part of collaborative care agreement.  Kendell Bane AGNP-C Internal Medicine

## 2020-03-04 DIAGNOSIS — Z0001 Encounter for general adult medical examination with abnormal findings: Secondary | ICD-10-CM | POA: Insufficient documentation

## 2020-03-04 DIAGNOSIS — R300 Dysuria: Secondary | ICD-10-CM | POA: Insufficient documentation

## 2020-03-04 DIAGNOSIS — Z9189 Other specified personal risk factors, not elsewhere classified: Secondary | ICD-10-CM | POA: Insufficient documentation

## 2020-03-04 DIAGNOSIS — Z1211 Encounter for screening for malignant neoplasm of colon: Secondary | ICD-10-CM | POA: Insufficient documentation

## 2020-04-05 ENCOUNTER — Telehealth: Payer: Self-pay

## 2020-04-05 NOTE — Telephone Encounter (Signed)
LEFT MESSAGE CONFIRMED PT APPT 04/09/20. BR

## 2020-04-09 ENCOUNTER — Ambulatory Visit: Payer: PRIVATE HEALTH INSURANCE | Admitting: Internal Medicine

## 2020-04-12 ENCOUNTER — Other Ambulatory Visit: Payer: Self-pay | Admitting: Internal Medicine

## 2020-04-12 DIAGNOSIS — E781 Pure hyperglyceridemia: Secondary | ICD-10-CM

## 2020-04-14 ENCOUNTER — Other Ambulatory Visit: Payer: Self-pay | Admitting: Adult Health

## 2020-04-23 ENCOUNTER — Ambulatory Visit: Payer: PRIVATE HEALTH INSURANCE | Admitting: Internal Medicine

## 2020-04-26 ENCOUNTER — Telehealth: Payer: Self-pay

## 2020-04-26 ENCOUNTER — Ambulatory Visit: Payer: PRIVATE HEALTH INSURANCE | Admitting: Internal Medicine

## 2020-04-26 NOTE — Telephone Encounter (Signed)
Left a message and advised of missed appt and asked him to callback to schedule pulmonary. Lawrence Yu

## 2020-04-27 ENCOUNTER — Other Ambulatory Visit: Payer: Self-pay | Admitting: Physician Assistant

## 2020-05-01 LAB — CMP14+LP+CBC/D/PLT+PB+AS+HG
ALT: 33 IU/L (ref 0–44)
AST: 26 IU/L (ref 0–40)
Albumin/Globulin Ratio: 2 (ref 1.2–2.2)
Albumin: 4.5 g/dL (ref 4.0–5.0)
Alkaline Phosphatase: 52 IU/L (ref 44–121)
Arsenic: 3 ug/L (ref 2–23)
BUN/Creatinine Ratio: 21 — ABNORMAL HIGH (ref 9–20)
BUN: 21 mg/dL (ref 6–24)
Basophils Absolute: 0 10*3/uL (ref 0.0–0.2)
Basos: 1 %
Bilirubin Total: 0.8 mg/dL (ref 0.0–1.2)
CO2: 19 mmol/L — ABNORMAL LOW (ref 20–29)
Calcium: 9.2 mg/dL (ref 8.7–10.2)
Chloride: 100 mmol/L (ref 96–106)
Cholesterol, Total: 230 mg/dL — ABNORMAL HIGH (ref 100–199)
Creatinine, Ser: 1.02 mg/dL (ref 0.76–1.27)
EOS (ABSOLUTE): 0.1 10*3/uL (ref 0.0–0.4)
Eos: 2 %
GFR calc Af Amer: 99 mL/min/{1.73_m2} (ref >59)
GFR calc non Af Amer: 85 mL/min/{1.73_m2} (ref >59)
Globulin, Total: 2.3 g/dL (ref 1.5–4.5)
Glucose: 183 mg/dL — ABNORMAL HIGH (ref 65–99)
HDL: 22 mg/dL — ABNORMAL LOW (ref >39)
Hematocrit: 42.1 % (ref 37.5–51.0)
Hemoglobin: 15.2 g/dL (ref 13.0–17.7)
Immature Grans (Abs): 0 10*3/uL (ref 0.0–0.1)
Immature Granulocytes: 0 %
Lead, Blood: 3 ug/dL (ref 0–4)
Lymphocytes Absolute: 1.4 10*3/uL (ref 0.7–3.1)
Lymphs: 43 %
MCH: 32.1 pg (ref 26.6–33.0)
MCHC: 36.1 g/dL — ABNORMAL HIGH (ref 31.5–35.7)
MCV: 89 fL (ref 79–97)
Mercury: <1 ug/L (ref 0.0–14.9)
Monocytes Absolute: 0.3 10*3/uL (ref 0.1–0.9)
Monocytes: 8 %
Neutrophils Absolute: 1.5 10*3/uL (ref 1.4–7.0)
Neutrophils: 46 %
Platelets: 134 10*3/uL — ABNORMAL LOW (ref 150–450)
Potassium: 4.3 mmol/L (ref 3.5–5.2)
RBC: 4.73 x10E6/uL (ref 4.14–5.80)
RDW: 13.7 % (ref 11.6–15.4)
Sodium: 139 mmol/L (ref 134–144)
Total Protein: 6.8 g/dL (ref 6.0–8.5)
Triglycerides: 1137 mg/dL (ref 0–149)
WBC: 3.3 10*3/uL — ABNORMAL LOW (ref 3.4–10.8)

## 2020-05-01 LAB — QUANTIFERON-TB GOLD PLUS
QuantiFERON Mitogen Value: >10 IU/mL
QuantiFERON Nil Value: 0 IU/mL
QuantiFERON TB1 Ag Value: 0 IU/mL
QuantiFERON TB2 Ag Value: 0.01 IU/mL
QuantiFERON-TB Gold Plus: NEGATIVE

## 2020-05-01 LAB — PSA: Prostate Specific Ag, Serum: 0.5 ng/mL (ref 0.0–4.0)

## 2020-05-03 ENCOUNTER — Ambulatory Visit: Payer: PRIVATE HEALTH INSURANCE | Admitting: Internal Medicine

## 2020-05-03 ENCOUNTER — Other Ambulatory Visit: Payer: Self-pay

## 2020-05-03 ENCOUNTER — Encounter: Payer: Self-pay | Admitting: Hospice and Palliative Medicine

## 2020-05-03 DIAGNOSIS — Z7189 Other specified counseling: Secondary | ICD-10-CM | POA: Diagnosis not present

## 2020-05-03 DIAGNOSIS — G4733 Obstructive sleep apnea (adult) (pediatric): Secondary | ICD-10-CM

## 2020-05-03 DIAGNOSIS — Z9989 Dependence on other enabling machines and devices: Secondary | ICD-10-CM | POA: Diagnosis not present

## 2020-05-03 DIAGNOSIS — K219 Gastro-esophageal reflux disease without esophagitis: Secondary | ICD-10-CM | POA: Diagnosis not present

## 2020-05-03 NOTE — Progress Notes (Signed)
[facility] [place], [place] [address]  Pulmonary Sleep Medicine   Office Visit Note  Patient Name: Lawrence Yu DOB: 07-05-1971 MRN [id number]  Date of Service: 05/03/2020  Complaints/HPI: Patient is here for routine pulmonary up Followed for OSA on CPAP--nightly compliance, no complications, denies congestion, dryness or headaches Has not had a recent download Cleans his machine by hand, changes his supplies as indicated GERD symptoms have remained controlled   ROS  General: (-) fever, (-) chills, (-) night sweats, (-) weakness Skin: (-) rashes, (-) itching,. Eyes: (-) visual changes, (-) redness, (-) itching. Nose and Sinuses: (-) nasal stuffiness or itchiness, (-) postnasal drip, (-) nosebleeds, (-) sinus trouble. Mouth and Throat: (-) sore throat, (-) hoarseness. Neck: (-) swollen glands, (-) enlarged thyroid, (-) neck pain. Respiratory: - cough, (-) bloody sputum, - shortness of breath, - wheezing. Cardiovascular: - ankle swelling, (-) chest pain. Lymphatic: (-) lymph node enlargement. Neurologic: (-) numbness, (-) tingling. Psychiatric: (-) anxiety, (-) depression   Current Medication: Outpatient Encounter Medications as of 05/03/2020  Medication Sig  . Choline Fenofibrate (FENOFIBRIC ACID) 135 MG CPDR TAKE 1 CAPSULE BY MOUTH  DAILY  . Dulaglutide (TRULICITY) 1.5 FV/4.9SW SOPN Inject 1.5 mg into the skin once a week.  Marland Kitchen LIFESCAN FINEPOINT LANCETS MISC Use to check blood sugar 2 time(s) daily.  Marland Kitchen lisinopril (ZESTRIL) 5 MG tablet TAKE 1 TABLET BY MOUTH  DAILY  . Misc. Devices MISC by Does not apply route. cpap device  . omeprazole (PRILOSEC) 20 MG capsule TAKE 1 CAPSULE BY MOUTH  DAILY  . sildenafil (REVATIO) 20 MG tablet Take 1 tablet (20 mg total) by mouth daily as needed. Take 2 tab daily as needed  . SYNJARDY 11-998 MG TABS TAKE 1 TABLET BY MOUTH TWICE DAILY  . VASCEPA 1 g capsule TAKE 2 CAPSULES BY MOUTH  TWICE DAILY  .  [DISCONTINUED] sulfamethoxazole-trimethoprim (BACTRIM) 400-80 MG tablet Take 1 tablet by mouth 2 (two) times daily.   No facility-administered encounter medications on file as of 05/03/2020.    Surgical History: Past Surgical History:  Procedure Laterality Date  . APPENDECTOMY      Medical History: Past Medical History:  Diagnosis Date  . Diabetes mellitus without complication ([place])    pre diabetic  . GERD (gastroesophageal reflux disease)   . Hypertension     Family History: History reviewed. No pertinent family history.  Social History: Social History   Socioeconomic History  . Marital status: Married    Spouse name: Not on file  . Number of children: Not on file  . Years of education: Not on file  . Highest education level: Not on file  Occupational History  . Not on file  Tobacco Use  . Smoking status: Never Smoker  . Smokeless tobacco: Never Used  Substance and Sexual Activity  . Alcohol use: No  . Drug use: No  . Sexual activity: Not on file  Other Topics Concern  . Not on file  Social History Narrative  . Not on file   Social Determinants of Health   Financial Resource Strain:   . Difficulty of Paying Living Expenses: Not on file  Food Insecurity:   . Worried About Charity fundraiser in the Last Year: Not on file  . Ran Out of Food in the Last Year: Not on file  Transportation Needs:   . Lack of Transportation (Medical): Not on file  . Lack of Transportation (Non-Medical): Not on file  Physical  Activity:   . Days of Exercise per Week: Not on file  . Minutes of Exercise per Session: Not on file  Stress:   . Feeling of Stress : Not on file  Social Connections:   . Frequency of Communication with Friends and Family: Not on file  . Frequency of Social Gatherings with Friends and Family: Not on file  . Attends Religious Services: Not on file  . Active Member of Clubs or Organizations: Not on file  . Attends Archivist Meetings: Not on  file  . Marital Status: Not on file  Intimate Partner Violence:   . Fear of Current or Ex-Partner: Not on file  . Emotionally Abused: Not on file  . Physically Abused: Not on file  . Sexually Abused: Not on file    Vital Signs: Blood pressure 130/80, pulse 96, resp. rate 16, height 5\' 11"  (1.803 m), weight 273 lb (123.8 kg), SpO2 96 %.  Examination: General Appearance: The patient is well-developed, well-nourished, and in no distress. Skin: Gross inspection of skin unremarkable. Head: normocephalic, no gross deformities. Eyes: no gross deformities noted. ENT: ears appear grossly normal no exudates. Neck: Supple. No thyromegaly. No LAD. Respiratory: Clear throughout, no rhonchi or rales noted. Cardiovascular: Normal S1 and S2 without murmur or rub. Extremities: No cyanosis. pulses are equal. Neurologic: Alert and oriented. No involuntary movements.  LABS: Recent Results (from the past 2160 hour(s))  POCT HgB A1C     Status: Abnormal   Collection Time: 02/07/20 12:08 PM  Result Value Ref Range   Hemoglobin A1C 7.3 (A) 4.0 - 5.6 %   HbA1c POC (<> result, manual entry)     HbA1c, POC (prediabetic range)     HbA1c, POC (controlled diabetic range)    UA/M w/rflx Culture, Routine     Status: Abnormal   Collection Time: 02/07/20  4:14 PM   Specimen: Urine   Urine  Result Value Ref Range   Specific Gravity, UA      >=1.030 (A) 1.005 - 1.030   pH, UA 5.5 5.0 - 7.5   Color, UA Yellow Yellow   Appearance Ur Clear Clear   Leukocytes,UA Negative Negative   Protein,UA Negative Negative/Trace   Glucose, UA 3+ (A) Negative   Ketones, UA Negative Negative   RBC, UA Negative Negative   Bilirubin, UA Negative Negative   Urobilinogen, Ur 0.2 0.2 - 1.0 mg/dL   Nitrite, UA Negative Negative   Microscopic Examination Comment     Comment: Microscopic follows if indicated.   Microscopic Examination See below:     Comment: Microscopic was indicated and was performed.   Urinalysis Reflex  Comment     Comment: This specimen will not reflex to a Urine Culture.  Microscopic Examination     Status: None   Collection Time: 02/07/20  4:14 PM   Urine  Result Value Ref Range   WBC, UA None seen 0 - 5 /hpf   RBC None seen 0 - 2 /hpf   Epithelial Cells (non renal) None seen 0 - 10 /hpf   Casts None seen None seen /lpf   Bacteria, UA None seen None seen/Few    Radiology: DG Knee Complete 4 Views Left  Result Date: 08/04/2016 CLINICAL DATA:  Pain and swelling along the medial left knee. EXAM: LEFT KNEE - COMPLETE 4+ VIEW COMPARISON:  Two MRI from 10/15/2007, 04/19/2007 radiographs of the left knee. 2 G FINDINGS: Tubular soft tissue densities are again seen along the medial aspect of  the calf consistent with varicose veins. Joint spaces are maintained. No acute fracture or malalignment. Ossific density adjacent to the supracondylar aspect of the medial femoral condyle was present in 2008 and may reflect an old remote MCL injury. IMPRESSION: No acute osseous abnormality noted radiographically. 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 Soft tissue ossification along the course of the proximal MCL may reflect an old remote injury is again noted. Electronically Signed   By: Ashley Royalty M.D.   On: 08/04/2016 18:48   Assessment and Plan: Patient Active Problem List   Diagnosis Date Noted  . Encounter for general adult medical examination with abnormal findings 03/04/2020  . At increased risk of exposure to COVID-19 virus 03/04/2020  . Dysuria 03/04/2020  . Hypertension 05/26/2017  . Sleep apnea 05/26/2017  . Type 2 diabetes mellitus with hyperglycemia, without long-term current use of insulin ([place]) 05/06/2017  . Dyslipidemia with low high density lipoprotein (HDL) cholesterol with hypertriglyceridemia due to type 2 diabetes mellitus ([place]) 05/06/2017    1. OSA on CPAP Continue with current CPAP settings, will set up for download for review and follow-up  2. CPAP use counseling Discussed  importance of adequate CPAP use as well as proper care and cleaning techniques of machine and all supplies.  3. Gastroesophageal reflux disease without esophagitis Symptoms remain stable at this time on current therapy, continue with monitoring  General Counseling: I have discussed the findings of the evaluation and examination with Balin.  I have also discussed any further diagnostic evaluation thatmay be needed or ordered today. Ason verbalizes understanding of the findings of todays visit. We also reviewed his medications today and discussed drug interactions and side effects including but not limited excessive drowsiness and altered mental states. We also discussed that there is always a risk not just to him but also people around him. he has been encouraged to call the office with any questions or concerns that should arise related to todays visit.     Time spent: 25  I have personally obtained a history, examined the patient, evaluated laboratory and imaging results, formulated the assessment and plan and placed orders. This patient was seen by Casey Burkitt AGNP-C in Collaboration with Dr. Devona Konig as a part of collaborative care agreement.    Allyne Gee, MD [facility] Pulmonary and Critical Care Sleep medicine

## 2020-05-04 ENCOUNTER — Encounter: Payer: Self-pay | Admitting: Internal Medicine

## 2020-05-04 NOTE — Patient Instructions (Signed)

## 2020-06-07 ENCOUNTER — Ambulatory Visit: Payer: PRIVATE HEALTH INSURANCE | Admitting: Nurse Practitioner

## 2020-06-18 ENCOUNTER — Other Ambulatory Visit: Payer: Self-pay

## 2020-06-18 ENCOUNTER — Encounter: Payer: Self-pay | Admitting: Nurse Practitioner

## 2020-06-18 ENCOUNTER — Ambulatory Visit: Payer: PRIVATE HEALTH INSURANCE | Admitting: Nurse Practitioner

## 2020-06-18 VITALS — BP 132/80 | HR 85 | Temp 98.0°F | Resp 16 | Ht 71.0 in | Wt 273.0 lb

## 2020-06-18 DIAGNOSIS — E1165 Type 2 diabetes mellitus with hyperglycemia: Secondary | ICD-10-CM | POA: Diagnosis not present

## 2020-06-18 DIAGNOSIS — I10 Essential (primary) hypertension: Secondary | ICD-10-CM | POA: Diagnosis not present

## 2020-06-18 DIAGNOSIS — E782 Mixed hyperlipidemia: Secondary | ICD-10-CM | POA: Diagnosis not present

## 2020-06-18 DIAGNOSIS — K219 Gastro-esophageal reflux disease without esophagitis: Secondary | ICD-10-CM | POA: Diagnosis not present

## 2020-06-18 DIAGNOSIS — G4733 Obstructive sleep apnea (adult) (pediatric): Secondary | ICD-10-CM

## 2020-06-18 DIAGNOSIS — Z9989 Dependence on other enabling machines and devices: Secondary | ICD-10-CM

## 2020-06-18 LAB — POCT GLYCOSYLATED HEMOGLOBIN (HGB A1C): Hemoglobin A1C: 7.2 % — AB (ref 4.0–5.6)

## 2020-06-18 MED ORDER — ROSUVASTATIN CALCIUM 20 MG PO TABS: 20.0000 mg | ORAL_TABLET | Freq: Every day | ORAL | 3 refills | Status: DC

## 2020-06-18 MED ORDER — SYNJARDY 5-1000 MG PO TABS: 1.0000 | ORAL_TABLET | Freq: Two times a day (BID) | ORAL | 3 refills | Status: DC

## 2020-06-18 MED ORDER — OMEPRAZOLE 20 MG PO CPDR: 20.0000 mg | DELAYED_RELEASE_CAPSULE | Freq: Every day | ORAL | 3 refills | Status: DC

## 2020-06-18 NOTE — Progress Notes (Signed)
[facility] [place], [place] [address]  Internal MEDICINE  Office Visit Note  Patient Name: Lawrence Yu  [id number]  [id number]  Date of Service: 07/18/2020  Chief Complaint  Patient presents with  . Follow-up  . Diabetes  . Gastroesophageal Reflux  . Hypertension  . controlled substance form    recieved    The patient is here for follow up visit. Is doing better with management of type 2 diabetes.  His HgbA1c is 7.2 today, down from 7.3 at last check. 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 His triglycerides continue to be over 1000. Last years triglyceride panel was >2000. His total cholesterol was 335 last year and is down to 230 this year. He does take fenofibrate daily, is not on a statin. Feels like he has been on a statin in the past and caused him joint and muscle pain, but is not sure. The patient does see cardiologist once yearly. Cardiology is aware of lipid panels.       Current Medication: Outpatient Encounter Medications as of 06/18/2020  Medication Sig  . Choline Fenofibrate (FENOFIBRIC ACID) 135 MG CPDR TAKE 1 CAPSULE BY MOUTH  DAILY  . Dulaglutide (TRULICITY) 1.5 AS/3.4HD SOPN Inject 1.5 mg into the skin once a week.  . Empagliflozin-metFORMIN HCl (SYNJARDY) 11-998 MG TABS Take 1 tablet by mouth 2 (two) times daily.  Marland Kitchen LIFESCAN FINEPOINT LANCETS MISC Use to check blood sugar 2 time(s) daily.  Marland Kitchen lisinopril (ZESTRIL) 5 MG tablet TAKE 1 TABLET BY MOUTH  DAILY  . Misc. Devices MISC by Does not apply route. cpap device  . omeprazole (PRILOSEC) 20 MG capsule Take 1 capsule (20 mg total) by mouth daily.  . sildenafil (REVATIO) 20 MG tablet Take 1 tablet (20 mg total) by mouth daily as needed. Take 2 tab daily as needed  . VASCEPA 1 g capsule TAKE 2 CAPSULES BY MOUTH  TWICE DAILY  . [DISCONTINUED] omeprazole (PRILOSEC) 20 MG capsule TAKE 1 CAPSULE BY MOUTH  DAILY  . [DISCONTINUED] SYNJARDY 11-998 MG TABS  TAKE 1 TABLET BY MOUTH TWICE DAILY  . rosuvastatin (CRESTOR) 20 MG tablet Take 1 tablet (20 mg total) by mouth daily.   No facility-administered encounter medications on file as of 06/18/2020.    Surgical History: Past Surgical History:  Procedure Laterality Date  . APPENDECTOMY      Medical History: Past Medical History:  Diagnosis Date  . Diabetes mellitus without complication ([place])    pre diabetic  . GERD (gastroesophageal reflux disease)   . Hypertension     Family History: History reviewed. No pertinent family history.  Social History   Socioeconomic History  . Marital status: Married    Spouse name: Not on file  . Number of children: Not on file  . Years of education: Not on file  . Highest education level: Not on file  Occupational History  . Not on file  Tobacco Use  . Smoking status: Never Smoker  . Smokeless tobacco: Never Used  Substance and Sexual Activity  . Alcohol use: No  . Drug use: No  . Sexual activity: Not on file  Other Topics Concern  . Not on file  Social History Narrative  . Not on file   Social Determinants of Health   Financial Resource Strain: Not on file  Food Insecurity: Not on file  Transportation Needs: Not on file  Physical Activity: Not on file  Stress: Not on file  Social Connections: Not on file  Intimate Partner Violence: Not on file      Review of Systems  Constitutional: Negative for activity change, chills, fatigue and unexpected weight change.  HENT: Negative for congestion, postnasal drip, rhinorrhea, sneezing and sore throat.   Respiratory: Negative for cough, chest tightness, shortness of breath and wheezing.   Cardiovascular: Negative for chest pain and palpitations.       Blood pressure is well managed   Gastrointestinal: Negative for abdominal pain, constipation, diarrhea, nausea and vomiting.  Endocrine: Negative for cold intolerance, heat intolerance, polydipsia and polyuria.       Stable blood sugar   Musculoskeletal: Negative for arthralgias, back pain, joint swelling and neck pain.  Skin: Negative for rash.  Allergic/Immunologic: Negative for environmental allergies.  Neurological: Negative for dizziness, tremors, numbness and headaches.  Hematological: Negative for adenopathy. Does not bruise/bleed easily.  Psychiatric/Behavioral: Negative for behavioral problems (Depression), sleep disturbance and suicidal ideas. The patient is not nervous/anxious.     Today's Vitals   06/18/20 1108  BP: 132/80  Pulse: 85  Resp: 16  Temp: 98 F (36.7 C)  SpO2: 96%  Weight: 273 lb (123.8 kg)  Height: 5\' 11"  (1.803 m)   Body mass index is 38.08 kg/m.  Physical Exam Vitals and nursing note reviewed.  Constitutional:      General: He is not in acute distress.    Appearance: Normal appearance. He is well-developed and well-nourished. He is obese. He is not diaphoretic.  HENT:     Head: Normocephalic and atraumatic.     Nose: Nose normal.     Mouth/Throat:     Mouth: Oropharynx is clear and moist.     Pharynx: No oropharyngeal exudate.  Eyes:     Extraocular Movements: EOM normal.     Pupils: Pupils are equal, round, and reactive to light.  Neck:     Thyroid: No thyromegaly.     Vascular: No carotid bruit or JVD.     Trachea: No tracheal deviation.  Cardiovascular:     Rate and Rhythm: Normal rate and regular rhythm.     Heart sounds: Normal heart sounds. No murmur heard. No friction rub. No gallop.   Pulmonary:     Effort: Pulmonary effort is normal. No respiratory distress.     Breath sounds: Normal breath sounds. No wheezing or rales.  Chest:     Chest wall: No tenderness.  Abdominal:     Palpations: Abdomen is soft.  Musculoskeletal:        General: Normal range of motion.     Cervical back: Normal range of motion and neck supple.  Lymphadenopathy:     Cervical: No cervical adenopathy.  Skin:    General: Skin is warm and dry.     Capillary Refill: Capillary refill  takes less than 2 seconds.  Neurological:     Mental Status: He is alert and oriented to person, place, and time.     Cranial Nerves: No cranial nerve deficit.  Psychiatric:        Mood and Affect: Mood and affect and mood normal.        Behavior: Behavior normal.        Thought Content: Thought content normal.        Judgment: Judgment normal.   Assessment/Plan:  1. Type 2 diabetes mellitus with hyperglycemia, without long-term current use of insulin (HCC) - POCT HgB A1C 7.2 today. Continue diabetic medication as prescribed   2. Mixed hyperlipidemia Reviewed  lipid panel with improved levels, however, triglycerides are very elevated. Cardiologist aware of levels. Continue crestor as prescirbed. Monitor closely - rosuvastatin (CRESTOR) 20 MG tablet; Take 1 tablet (20 mg total) by mouth daily.  Dispense: 90 tablet; Refill: 3  3. Hypertension, unspecified type Stable. Continue lisinopril as prescribed   4. Gastroesophageal reflux disease without esophagitis May take omeprazole 20mg  daily.  - omeprazole (PRILOSEC) 20 MG capsule; Take 1 capsule (20 mg total) by mouth daily.  Dispense: 90 capsule; Refill: 3  5. OSA on CPAP Continue regular visits with Dr. Devona Konig for CPAP management.   General Counseling: Klein verbalizes understanding of the findings of todays visit and agrees with plan of treatment. I have discussed any further diagnostic evaluation that may be needed or ordered today. We also reviewed his medications today. he has been encouraged to call the office with any questions or concerns that should arise related to todays visit.  Cardiac risk factor modification:  1. Control blood pressure. 2. Exercise as prescribed. 3. Follow low sodium, low fat diet. and low fat and low cholestrol diet. 4. Take ASA 81mg  once a day. 5. Restricted calories diet to lose weight.  This patient was seen by Leretha Pol FNP Collaboration with Dr Lavera Guise as a part of collaborative care  agreement  Orders Placed This Encounter  Procedures  . POCT HgB A1C    Meds ordered this encounter  Medications  . rosuvastatin (CRESTOR) 20 MG tablet    Sig: Take 1 tablet (20 mg total) by mouth daily.    Dispense:  90 tablet    Refill:  3    Order Specific Question:   Supervising Provider    Answer:   Lavera Guise [[id number]]  . Empagliflozin-metFORMIN HCl (SYNJARDY) 11-998 MG TABS    Sig: Take 1 tablet by mouth 2 (two) times daily.    Dispense:  180 tablet    Refill:  3    Order Specific Question:   Supervising Provider    Answer:   Lavera Guise [[id number]]  . omeprazole (PRILOSEC) 20 MG capsule    Sig: Take 1 capsule (20 mg total) by mouth daily.    Dispense:  90 capsule    Refill:  3    Requesting 1 year supply    Order Specific Question:   Supervising Provider    Answer:   Lavera Guise [[id number]]    Total time spent: 30 Minutes   Time spent includes review of chart, medications, test results, and follow up plan with the patient.      Dr Lavera Guise Internal medicine

## 2020-07-18 DIAGNOSIS — K219 Gastro-esophageal reflux disease without esophagitis: Secondary | ICD-10-CM | POA: Insufficient documentation

## 2020-07-18 DIAGNOSIS — E782 Mixed hyperlipidemia: Secondary | ICD-10-CM | POA: Insufficient documentation

## 2020-09-07 ENCOUNTER — Telehealth: Payer: Self-pay

## 2020-09-07 NOTE — Telephone Encounter (Signed)
Received PA request for TRULICITY from covermymeds.  I called Pt's pharmacy and asked if medication has been ran thru insurance and per [place] pharmacist it was ran thru at no charge for medication.  So no PA was needed for medication as of 09/07/2020

## 2020-09-14 ENCOUNTER — Telehealth: Payer: Self-pay

## 2020-09-14 NOTE — Telephone Encounter (Signed)
LMOM returning his call about more info regarding appt 09-19-20

## 2020-09-19 ENCOUNTER — Ambulatory Visit: Payer: PRIVATE HEALTH INSURANCE | Admitting: Hospice and Palliative Medicine

## 2020-10-03 ENCOUNTER — Ambulatory Visit: Payer: PRIVATE HEALTH INSURANCE | Admitting: Hospice and Palliative Medicine

## 2020-11-14 ENCOUNTER — Ambulatory Visit: Payer: PRIVATE HEALTH INSURANCE

## 2020-11-19 ENCOUNTER — Other Ambulatory Visit: Payer: Self-pay

## 2020-11-19 MED ORDER — LISINOPRIL 5 MG PO TABS: 5.0000 mg | ORAL_TABLET | Freq: Every day | ORAL | 0 refills | Status: DC

## 2020-11-22 ENCOUNTER — Ambulatory Visit: Payer: PRIVATE HEALTH INSURANCE | Admitting: Internal Medicine

## 2020-12-05 ENCOUNTER — Ambulatory Visit: Payer: PRIVATE HEALTH INSURANCE

## 2020-12-19 ENCOUNTER — Other Ambulatory Visit: Payer: Self-pay

## 2020-12-19 ENCOUNTER — Ambulatory Visit: Payer: PRIVATE HEALTH INSURANCE

## 2020-12-19 DIAGNOSIS — G4733 Obstructive sleep apnea (adult) (pediatric): Secondary | ICD-10-CM | POA: Diagnosis not present

## 2020-12-19 NOTE — Progress Notes (Signed)
95 percentile pressure 9   95th percentile leak 3.4   apnea index 0.7 /hr  apnea-hypopnea index  4.2 /hr   total days used  >4 hr 90 days  total days used <4 hr 0 days  Total compliance 100 percent  He is great no problems or questions at this time

## 2020-12-20 ENCOUNTER — Ambulatory Visit: Payer: PRIVATE HEALTH INSURANCE | Admitting: Physician Assistant

## 2020-12-27 ENCOUNTER — Telehealth: Payer: Self-pay

## 2020-12-27 NOTE — Telephone Encounter (Signed)
Lvm to arrive @ 11:00 for 12/31/20 appointment-Toni

## 2020-12-31 ENCOUNTER — Ambulatory Visit: Payer: PRIVATE HEALTH INSURANCE | Admitting: Physician Assistant

## 2021-01-14 ENCOUNTER — Ambulatory Visit: Payer: PRIVATE HEALTH INSURANCE | Admitting: Physician Assistant

## 2021-01-14 ENCOUNTER — Encounter: Payer: Self-pay | Admitting: Physician Assistant

## 2021-01-14 DIAGNOSIS — G4733 Obstructive sleep apnea (adult) (pediatric): Secondary | ICD-10-CM

## 2021-01-14 DIAGNOSIS — Z6838 Body mass index (BMI) 38.0-38.9, adult: Secondary | ICD-10-CM

## 2021-01-14 DIAGNOSIS — Z7189 Other specified counseling: Secondary | ICD-10-CM

## 2021-01-14 DIAGNOSIS — I10 Essential (primary) hypertension: Secondary | ICD-10-CM

## 2021-01-14 NOTE — Progress Notes (Signed)
[facility] [place], [place] [address]  Internal MEDICINE  Telephone Visit  Patient Name: Lawrence Yu  [id number]  [id number]  Date of Service: 01/15/2021  I connected with the patient at 1:50 by telephone and verified the patients identity using two identifiers.   I discussed the limitations, risks, security and privacy concerns of performing an evaluation and management service by telephone and the availability of in person appointments. I also discussed with the patient that there may be a patient responsible charge related to the service.  The patient expressed understanding and agrees to proceed.    Chief Complaint  Patient presents with   Acute Visit    Covid exposure    Diabetes   Gastroesophageal Reflux   Hypertension   Telephone Assessment   Telephone Screen    [phone] virtual    Cough    Dry couch    HPI Pt is here for virtual routine pulmonary follow up. -Had covid June 9th, and then wife tested positive June 14th. He has recovered and is doing well other than an occasional lingering cough. -Followed for OSA, He is wearing CPAP nightly and has no complaints. Denies any congestions, dryness, or morning headaches. He is cleaning machine regularly.  -His compliance is 100% with AHI of 4.2 -GERD well controlled  Current Medication: Outpatient Encounter Medications as of 01/14/2021  Medication Sig   Choline Fenofibrate (FENOFIBRIC ACID) 135 MG CPDR TAKE 1 CAPSULE BY MOUTH  DAILY   Dulaglutide (TRULICITY) 1.5 DH/7.4BU SOPN Inject 1.5 mg into the skin once a week.   Empagliflozin-metFORMIN HCl (SYNJARDY) 11-998 MG TABS Take 1 tablet by mouth 2 (two) times daily.   LIFESCAN FINEPOINT LANCETS MISC Use to check blood sugar 2 time(s) daily.   lisinopril (ZESTRIL) 5 MG tablet Take 1 tablet (5 mg total) by mouth daily.   Misc. Devices MISC by Does not apply route. cpap device   omeprazole (PRILOSEC) 20 MG capsule Take 1 capsule (20 mg total)  by mouth daily.   rosuvastatin (CRESTOR) 20 MG tablet Take 1 tablet (20 mg total) by mouth daily.   sildenafil (REVATIO) 20 MG tablet Take 1 tablet (20 mg total) by mouth daily as needed. Take 2 tab daily as needed   VASCEPA 1 g capsule TAKE 2 CAPSULES BY MOUTH  TWICE DAILY   No facility-administered encounter medications on file as of 01/14/2021.    Surgical History: Past Surgical History:  Procedure Laterality Date   APPENDECTOMY      Medical History: Past Medical History:  Diagnosis Date   Diabetes mellitus without complication ([place])    pre diabetic   GERD (gastroesophageal reflux disease)    Hypertension     Family History: History reviewed. No pertinent family history.  Social History   Socioeconomic History   Marital status: Married    Spouse name: Not on file   Number of children: Not on file   Years of education: Not on file   Highest education level: Not on file  Occupational History   Not on file  Tobacco Use   Smoking status: Never   Smokeless tobacco: Never  Substance and Sexual Activity   Alcohol use: No   Drug use: No   Sexual activity: Not on file  Other Topics Concern   Not on file  Social History Narrative   Not on file   Social Determinants of Health   Financial Resource Strain: Not on file  Food Insecurity: Not on file  Transportation Needs: Not on file  Physical Activity: Not on file  Stress: Not on file  Social Connections: Not on file  Intimate Partner Violence: Not on file      Review of Systems  Constitutional:  Negative for fever.  HENT:  Negative for congestion, rhinorrhea and sinus pressure.   Eyes:  Negative for visual disturbance.  Respiratory:  Positive for cough. Negative for shortness of breath and wheezing.   Cardiovascular:  Negative for chest pain and palpitations.  Skin:  Negative for rash.  Neurological:  Negative for numbness and headaches.  Psychiatric/Behavioral:  Negative for behavioral problems. The patient  is not nervous/anxious.    Vital Signs: BP 138/82   Pulse (!) 102   Wt 273 lb (123.8 kg)   SpO2 97%   BMI 38.08 kg/m    Observation/Objective:  Pt is able to carry out conversation.   Assessment/Plan: 1. Obstructive sleep apnea Continue excellent compliance  2. CPAP use counseling CPAP couseling-Discussed importance of adequate CPAP use as well as proper care and cleaning techniques of machine and all supplies.  3. Hypertension, unspecified type Stable, continue current medications and f/u with PCP  4. Class 2 severe obesity due to excess calories with serious comorbidity and body mass index (BMI) of 38.0 to 38.9 in adult [facility] Obesity Counseling: Had a lengthy discussion regarding patients BMI and weight issues. Patient was instructed on portion control as well as increased activity. Also discussed caloric restrictions with trying to maintain intake less than 2000 Kcal. Discussions were made in accordance with the 5As of weight management. Simple actions such as not eating late and if able to, taking a walk is suggested.    General Counseling: Lawrence Yu verbalizes understanding of the findings of today's phone visit and agrees with plan of treatment. I have discussed any further diagnostic evaluation that may be needed or ordered today. We also reviewed his medications today. he has been encouraged to call the office with any questions or concerns that should arise related to todays visit.    No orders of the defined types were placed in this encounter.   No orders of the defined types were placed in this encounter.   Time spent:30 Minutes    Dr Lavera Guise Internal medicine

## 2021-01-15 NOTE — Patient Instructions (Signed)

## 2021-02-15 ENCOUNTER — Telehealth: Payer: PRIVATE HEALTH INSURANCE | Admitting: Physician Assistant

## 2021-02-27 ENCOUNTER — Other Ambulatory Visit: Payer: Self-pay | Admitting: Internal Medicine

## 2021-02-27 DIAGNOSIS — E781 Pure hyperglyceridemia: Secondary | ICD-10-CM

## 2021-04-30 ENCOUNTER — Other Ambulatory Visit: Payer: Self-pay

## 2021-04-30 ENCOUNTER — Other Ambulatory Visit: Payer: Self-pay | Admitting: Physician Assistant

## 2021-04-30 ENCOUNTER — Ambulatory Visit
Admission: RE | Admit: 2021-04-30 | Discharge: 2021-04-30 | Disposition: A | Discharge status: Home / Self Care | Payer: BC Managed Care – PPO | Source: Ambulatory Visit | Attending: Physician Assistant | Admitting: Physician Assistant

## 2021-04-30 DIAGNOSIS — Z0000 Encounter for general adult medical examination without abnormal findings: Secondary | ICD-10-CM | POA: Insufficient documentation

## 2021-06-27 ENCOUNTER — Ambulatory Visit: Payer: BC Managed Care – PPO | Admitting: Physician Assistant

## 2021-06-27 ENCOUNTER — Encounter: Payer: Self-pay | Admitting: Physician Assistant

## 2021-06-27 ENCOUNTER — Other Ambulatory Visit: Payer: Self-pay

## 2021-06-27 VITALS — BP 140/80 | HR 97 | Temp 98.5°F | Resp 16 | Ht 71.0 in | Wt 267.6 lb

## 2021-06-27 DIAGNOSIS — N529 Male erectile dysfunction, unspecified: Secondary | ICD-10-CM

## 2021-06-27 DIAGNOSIS — E781 Pure hyperglyceridemia: Secondary | ICD-10-CM | POA: Diagnosis not present

## 2021-06-27 DIAGNOSIS — K219 Gastro-esophageal reflux disease without esophagitis: Secondary | ICD-10-CM | POA: Diagnosis not present

## 2021-06-27 DIAGNOSIS — I10 Essential (primary) hypertension: Secondary | ICD-10-CM

## 2021-06-27 DIAGNOSIS — E1165 Type 2 diabetes mellitus with hyperglycemia: Secondary | ICD-10-CM | POA: Diagnosis not present

## 2021-06-27 LAB — POCT GLYCOSYLATED HEMOGLOBIN (HGB A1C): Hemoglobin A1C: 7.7 % — AB (ref 4.0–5.6)

## 2021-06-27 MED ORDER — ICOSAPENT ETHYL 1 G PO CAPS: 2.0000 g | ORAL_CAPSULE | Freq: Two times a day (BID) | ORAL | 1 refills | Status: DC

## 2021-06-27 MED ORDER — OMEPRAZOLE 20 MG PO CPDR: 20.0000 mg | DELAYED_RELEASE_CAPSULE | Freq: Every day | ORAL | 3 refills | Status: DC

## 2021-06-27 MED ORDER — LISINOPRIL 5 MG PO TABS: 5.0000 mg | ORAL_TABLET | Freq: Every day | ORAL | 3 refills | Status: DC

## 2021-06-27 MED ORDER — SILDENAFIL CITRATE 20 MG PO TABS: 20.0000 mg | ORAL_TABLET | Freq: Every day | ORAL | 3 refills | Status: DC | PRN

## 2021-06-27 MED ORDER — METFORMIN HCL 1000 MG PO TABS
1000.0000 mg | ORAL_TABLET | Freq: Every day | ORAL | 3 refills | Status: DC
Start: 2021-06-27 — End: 2022-05-02

## 2021-06-27 MED ORDER — EMPAGLIFLOZIN 25 MG PO TABS: 25.0000 mg | ORAL_TABLET | Freq: Every day | ORAL | 1 refills | Status: DC

## 2021-06-27 NOTE — Progress Notes (Signed)
[facility] [place], [address] [address]  Internal MEDICINE  Office Visit Note  Patient Name: Lawrence Yu  [id number]  [id number]  Date of Service: 07/02/2021  Chief Complaint  Patient presents with   Follow-up   Diabetes   Hypertension   Medication Refill   Quality Metric Gaps    Needs CPE to review    HPI Pt is here for routine follow up -BG not checked routinely, sometimes will check in Am if not feeling great and will range 145-220.  He has been taking Synjardy daily as prescribed however he states he is aware that his insurance changing will no longer cover this and may need an alternative.  Due to elevated A1c we will go ahead and increase Jardiance to 25 mg and separated from metformin to keep metformin at 1000 mg and see if insurance will cover medication separately. Patient also advised to contact insurance agency to see which medications are preferred -Out of lisinopril for the last month and may contribute to his elevated blood pressure reading initially today.  BP did improve on recheck. 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  He will be due for physical in our office at next visit however we will not reorder lab work pending we receive blood work from his department physical -He is also aware that he needs to maintain regular follow-ups in order to keep getting his medication and for [place] to be able to treat him appropriately especially given his diabetes has been uncontrolled and has not had not come in for follow-up A1c checks in the last year.  Patient is aware that he needs to take better care of himself with routine checkups yes  Current Medication: Outpatient Encounter Medications as of 06/27/2021  Medication Sig   Choline Fenofibrate (FENOFIBRIC ACID) 135 MG CPDR TAKE 1 CAPSULE BY  MOUTH  DAILY   empagliflozin (JARDIANCE) 25 MG TABS tablet Take 1 tablet (25 mg total) by mouth daily before breakfast.   LIFESCAN FINEPOINT LANCETS MISC Use to check blood sugar 2 time(s) daily.   metFORMIN (GLUCOPHAGE) 1000 MG tablet Take 1 tablet (1,000 mg total) by mouth daily with breakfast.   Misc. Devices MISC by Does not apply route. cpap device   [DISCONTINUED] Empagliflozin-metFORMIN HCl (SYNJARDY) 11-998 MG TABS Take 1 tablet by mouth 2 (two) times daily.   [DISCONTINUED] lisinopril (ZESTRIL) 5 MG tablet Take 1 tablet (5 mg total) by mouth daily.   [DISCONTINUED] omeprazole (PRILOSEC) 20 MG capsule Take 1 capsule (20 mg total) by mouth daily.   [DISCONTINUED] sildenafil (REVATIO) 20 MG tablet Take 1 tablet (20 mg total) by mouth daily as needed. Take 2 tab daily as needed   [DISCONTINUED] VASCEPA 1 g capsule TAKE 2 CAPSULES BY MOUTH  TWICE DAILY   icosapent Ethyl (VASCEPA) 1 g capsule Take 2 capsules (2 g total) by mouth 2 (two) times daily.   lisinopril (ZESTRIL) 5 MG tablet Take 1 tablet (5 mg total) by mouth daily.   omeprazole (PRILOSEC) 20 MG capsule Take 1 capsule (20 mg total) by mouth daily.   [DISCONTINUED] Dulaglutide (TRULICITY) 1.5 WC/5.8NI SOPN Inject 1.5 mg into the skin once a week. (Patient not taking: Reported on 06/27/2021)   [DISCONTINUED] rosuvastatin (CRESTOR) 20 MG tablet Take 1 tablet (20 mg total) by mouth daily. (Patient  not taking: Reported on 06/27/2021)   [DISCONTINUED] sildenafil (REVATIO) 20 MG tablet Take 1 tablet (20 mg total) by mouth daily as needed. Take 2 tab daily as needed   No facility-administered encounter medications on file as of 06/27/2021.    Surgical History: Past Surgical History:  Procedure Laterality Date   APPENDECTOMY      Medical History: Past Medical History:  Diagnosis Date   Diabetes mellitus without complication ([place])    pre diabetic   GERD (gastroesophageal reflux disease)    Hypertension     Family History: Family  History  Problem Relation Age of Onset   Diabetes Father    Kidney disease Father    Cancer Father    Dementia Maternal Grandfather    Dementia Paternal Grandmother     Social History   Socioeconomic History   Marital status: Married    Spouse name: Not on file   Number of children: Not on file   Years of education: Not on file   Highest education level: Not on file  Occupational History   Not on file  Tobacco Use   Smoking status: Never   Smokeless tobacco: Never  Substance and Sexual Activity   Alcohol use: No   Drug use: No   Sexual activity: Not on file  Other Topics Concern   Not on file  Social History Narrative   Not on file   Social Determinants of Health   Financial Resource Strain: Not on file  Food Insecurity: Not on file  Transportation Needs: Not on file  Physical Activity: Not on file  Stress: Not on file  Social Connections: Not on file  Intimate Partner Violence: Not on file      Review of Systems  Constitutional:  Negative for chills, fatigue and unexpected weight change.  HENT:  Negative for congestion, postnasal drip, rhinorrhea, sneezing and sore throat.   Eyes:  Negative for redness.  Respiratory:  Negative for cough, chest tightness and shortness of breath.   Cardiovascular:  Negative for chest pain and palpitations.  Gastrointestinal:  Negative for abdominal pain, constipation, diarrhea, nausea and vomiting.  Genitourinary:  Negative for dysuria and frequency.  Musculoskeletal:  Negative for arthralgias, back pain, joint swelling and neck pain.  Skin:  Negative for rash.  Neurological: Negative.  Negative for tremors and numbness.  Hematological:  Negative for adenopathy. Does not bruise/bleed easily.  Psychiatric/Behavioral:  Negative for behavioral problems (Depression), sleep disturbance and suicidal ideas. The patient is not nervous/anxious.    Vital Signs: BP 140/80 Comment: 143/88  Pulse 97   Temp 98.5 F (36.9 C)   Resp 16    Ht 5\' 11"  (1.803 m)   Wt 267 lb 9.6 oz (121.4 kg)   SpO2 98%   BMI 37.32 kg/m    Physical Exam Vitals and nursing note reviewed.  Constitutional:      General: He is not in acute distress.    Appearance: Normal appearance. He is well-developed. He is obese. He is not diaphoretic.  HENT:     Head: Normocephalic and atraumatic.     Nose: Nose normal.     Mouth/Throat:     Pharynx: No oropharyngeal exudate.  Eyes:     Pupils: Pupils are equal, round, and reactive to light.  Neck:     Thyroid: No thyromegaly.     Vascular: No carotid bruit or JVD.     Trachea: No tracheal deviation.  Cardiovascular:     Rate and Rhythm: Normal rate  and regular rhythm.     Heart sounds: Normal heart sounds. No murmur heard.   No friction rub. No gallop.  Pulmonary:     Effort: Pulmonary effort is normal. No respiratory distress.     Breath sounds: Normal breath sounds. No wheezing or rales.  Chest:     Chest wall: No tenderness.  Abdominal:     Palpations: Abdomen is soft.  Musculoskeletal:        General: Normal range of motion.     Cervical back: Normal range of motion and neck supple.  Lymphadenopathy:     Cervical: No cervical adenopathy.  Skin:    General: Skin is warm and dry.     Capillary Refill: Capillary refill takes less than 2 seconds.  Neurological:     Mental Status: He is alert and oriented to person, place, and time.     Cranial Nerves: No cranial nerve deficit.  Psychiatric:        Mood and Affect: Mood normal.        Behavior: Behavior normal.        Thought Content: Thought content normal.        Judgment: Judgment normal.       Assessment/Plan: 1. Type 2 diabetes mellitus with hyperglycemia, without long-term current use of insulin (HCC) - POCT HgB A1C is 7.7 which is increased from 7.2 last check, will adjust meds to increase jardiance dosage. Advised to monitor BG at home and work on diet/exercise - empagliflozin (JARDIANCE) 25 MG TABS tablet; Take 1 tablet  (25 mg total) by mouth daily before breakfast.  Dispense: 90 tablet; Refill: 1 - metFORMIN (GLUCOPHAGE) 1000 MG tablet; Take 1 tablet (1,000 mg total) by mouth daily with breakfast.  Dispense: 180 tablet; Refill: 3  2. Hypertension, unspecified type Stable, continue current medication  3. Gastroesophageal reflux disease without esophagitis - omeprazole (PRILOSEC) 20 MG capsule; Take 1 capsule (20 mg total) by mouth daily.  Dispense: 90 capsule; Refill: 3  4. Hypertriglyceridemia Will send updated labs from dept physical and continue current medications - icosapent Ethyl (VASCEPA) 1 g capsule; Take 2 capsules (2 g total) by mouth 2 (two) times daily.  Dispense: 360 capsule; Refill: 1  5. Erectile dysfunction, unspecified erectile dysfunction type May continue sildenafil as needed   General Counseling: Al verbalizes understanding of the findings of todays visit and agrees with plan of treatment. I have discussed any further diagnostic evaluation that may be needed or ordered today. We also reviewed his medications today. he has been encouraged to call the office with any questions or concerns that should arise related to todays visit.    Orders Placed This Encounter  Procedures   POCT HgB A1C    Meds ordered this encounter  Medications   empagliflozin (JARDIANCE) 25 MG TABS tablet    Sig: Take 1 tablet (25 mg total) by mouth daily before breakfast.    Dispense:  90 tablet    Refill:  1   metFORMIN (GLUCOPHAGE) 1000 MG tablet    Sig: Take 1 tablet (1,000 mg total) by mouth daily with breakfast.    Dispense:  180 tablet    Refill:  3   lisinopril (ZESTRIL) 5 MG tablet    Sig: Take 1 tablet (5 mg total) by mouth daily.    Dispense:  90 tablet    Refill:  3    Requesting 1 year supply   omeprazole (PRILOSEC) 20 MG capsule    Sig: Take 1 capsule (20  mg total) by mouth daily.    Dispense:  90 capsule    Refill:  3    Requesting 1 year supply   icosapent Ethyl (VASCEPA) 1 g  capsule    Sig: Take 2 capsules (2 g total) by mouth 2 (two) times daily.    Dispense:  360 capsule    Refill:  1    Requesting 1 year supply   DISCONTD: sildenafil (REVATIO) 20 MG tablet    Sig: Take 1 tablet (20 mg total) by mouth daily as needed. Take 2 tab daily as needed    Dispense:  30 tablet    Refill:  3    This patient was seen by Drema Dallas, [name] in collaboration with Dr. Clayborn Bigness as a part of collaborative care agreement.   Total time spent:30 Minutes Time spent includes review of chart, medications, test results, and follow up plan with the patient.      Dr Lavera Guise Internal medicine

## 2021-07-04 ENCOUNTER — Other Ambulatory Visit: Payer: Self-pay | Admitting: Adult Health

## 2021-07-18 ENCOUNTER — Ambulatory Visit: Payer: PRIVATE HEALTH INSURANCE | Admitting: Physician Assistant

## 2021-08-21 ENCOUNTER — Ambulatory Visit: Payer: PRIVATE HEALTH INSURANCE

## 2021-08-21 DIAGNOSIS — E119 Type 2 diabetes mellitus without complications: Secondary | ICD-10-CM | POA: Diagnosis not present

## 2021-09-03 ENCOUNTER — Encounter: Payer: Self-pay | Admitting: Physician Assistant

## 2021-09-05 ENCOUNTER — Telehealth: Payer: Self-pay

## 2021-09-05 NOTE — Telephone Encounter (Signed)
Left vm and sent mychart message to confirm 09/09/21 appointment-Toni

## 2021-09-09 ENCOUNTER — Telehealth: Payer: Self-pay

## 2021-09-09 ENCOUNTER — Encounter: Payer: BC Managed Care – PPO | Admitting: Physician Assistant

## 2021-09-09 DIAGNOSIS — Z0289 Encounter for other administrative examinations: Secondary | ICD-10-CM

## 2021-09-09 NOTE — Telephone Encounter (Signed)
Left voicemail asking patient to reschedule missed ov from 09-09-21. Patient needs to be advised to keep next appointment or possibility of dismissal.

## 2021-09-18 DIAGNOSIS — E119 Type 2 diabetes mellitus without complications: Secondary | ICD-10-CM | POA: Diagnosis not present

## 2021-10-19 DIAGNOSIS — E119 Type 2 diabetes mellitus without complications: Secondary | ICD-10-CM | POA: Diagnosis not present

## 2021-11-18 DIAGNOSIS — E119 Type 2 diabetes mellitus without complications: Secondary | ICD-10-CM | POA: Diagnosis not present

## 2021-12-19 DIAGNOSIS — E119 Type 2 diabetes mellitus without complications: Secondary | ICD-10-CM | POA: Diagnosis not present

## 2021-12-29 ENCOUNTER — Other Ambulatory Visit: Payer: Self-pay | Admitting: Physician Assistant

## 2021-12-29 DIAGNOSIS — E1165 Type 2 diabetes mellitus with hyperglycemia: Secondary | ICD-10-CM

## 2022-01-18 DIAGNOSIS — E119 Type 2 diabetes mellitus without complications: Secondary | ICD-10-CM | POA: Diagnosis not present

## 2022-02-17 DIAGNOSIS — M533 Sacrococcygeal disorders, not elsewhere classified: Secondary | ICD-10-CM | POA: Insufficient documentation

## 2022-02-17 DIAGNOSIS — S39012A Strain of muscle, fascia and tendon of lower back, initial encounter: Secondary | ICD-10-CM | POA: Insufficient documentation

## 2022-02-17 DIAGNOSIS — M47816 Spondylosis without myelopathy or radiculopathy, lumbar region: Secondary | ICD-10-CM | POA: Insufficient documentation

## 2022-02-17 DIAGNOSIS — M545 Low back pain, unspecified: Secondary | ICD-10-CM | POA: Insufficient documentation

## 2022-02-18 DIAGNOSIS — E119 Type 2 diabetes mellitus without complications: Secondary | ICD-10-CM | POA: Diagnosis not present

## 2022-03-21 DIAGNOSIS — E119 Type 2 diabetes mellitus without complications: Secondary | ICD-10-CM | POA: Diagnosis not present

## 2022-04-01 ENCOUNTER — Other Ambulatory Visit: Payer: Self-pay | Admitting: Physician Assistant

## 2022-04-01 DIAGNOSIS — K219 Gastro-esophageal reflux disease without esophagitis: Secondary | ICD-10-CM

## 2022-04-15 ENCOUNTER — Other Ambulatory Visit: Payer: Self-pay | Admitting: Physician Assistant

## 2022-04-15 DIAGNOSIS — E1165 Type 2 diabetes mellitus with hyperglycemia: Secondary | ICD-10-CM

## 2022-04-20 DIAGNOSIS — E119 Type 2 diabetes mellitus without complications: Secondary | ICD-10-CM | POA: Diagnosis not present

## 2022-04-22 ENCOUNTER — Other Ambulatory Visit: Payer: Self-pay

## 2022-04-22 DIAGNOSIS — E1165 Type 2 diabetes mellitus with hyperglycemia: Secondary | ICD-10-CM

## 2022-04-22 MED ORDER — EMPAGLIFLOZIN 25 MG PO TABS: ORAL_TABLET | ORAL | 0 refills | Status: DC

## 2022-04-22 MED ORDER — LISINOPRIL 5 MG PO TABS: 5.0000 mg | ORAL_TABLET | Freq: Every day | ORAL | 0 refills | Status: DC

## 2022-04-22 NOTE — Telephone Encounter (Signed)
Pt called for med refill for lisinopril and jardiance make him appt on next Friday and send 30 days med refills to phar and advised him to keep appt

## 2022-05-02 ENCOUNTER — Ambulatory Visit (INDEPENDENT_AMBULATORY_CARE_PROVIDER_SITE_OTHER): Payer: BC Managed Care – PPO | Admitting: Physician Assistant

## 2022-05-02 ENCOUNTER — Encounter: Payer: Self-pay | Admitting: Physician Assistant

## 2022-05-02 VITALS — BP 136/88 | HR 91 | Temp 98.4°F | Resp 16 | Ht 71.0 in | Wt 254.2 lb

## 2022-05-02 DIAGNOSIS — Z1211 Encounter for screening for malignant neoplasm of colon: Secondary | ICD-10-CM

## 2022-05-02 DIAGNOSIS — K219 Gastro-esophageal reflux disease without esophagitis: Secondary | ICD-10-CM

## 2022-05-02 DIAGNOSIS — I10 Essential (primary) hypertension: Secondary | ICD-10-CM

## 2022-05-02 DIAGNOSIS — E782 Mixed hyperlipidemia: Secondary | ICD-10-CM | POA: Diagnosis not present

## 2022-05-02 DIAGNOSIS — E1165 Type 2 diabetes mellitus with hyperglycemia: Secondary | ICD-10-CM

## 2022-05-02 DIAGNOSIS — Z1212 Encounter for screening for malignant neoplasm of rectum: Secondary | ICD-10-CM

## 2022-05-02 DIAGNOSIS — E781 Pure hyperglyceridemia: Secondary | ICD-10-CM

## 2022-05-02 LAB — POCT GLYCOSYLATED HEMOGLOBIN (HGB A1C): Hemoglobin A1C: 7.7 % — AB (ref 4.0–5.6)

## 2022-05-02 MED ORDER — EMPAGLIFLOZIN 25 MG PO TABS: ORAL_TABLET | ORAL | 1 refills | Status: DC

## 2022-05-02 MED ORDER — LISINOPRIL 5 MG PO TABS: 5.0000 mg | ORAL_TABLET | Freq: Every day | ORAL | 1 refills | Status: DC

## 2022-05-02 MED ORDER — ROSUVASTATIN CALCIUM 5 MG PO TABS: 5.0000 mg | ORAL_TABLET | Freq: Every day | ORAL | 3 refills | Status: DC

## 2022-05-02 MED ORDER — OMEPRAZOLE 20 MG PO CPDR: 20.0000 mg | DELAYED_RELEASE_CAPSULE | Freq: Every day | ORAL | 3 refills | Status: DC

## 2022-05-02 MED ORDER — ICOSAPENT ETHYL 1 G PO CAPS: 2.0000 g | ORAL_CAPSULE | Freq: Two times a day (BID) | ORAL | 1 refills | Status: DC

## 2022-05-02 MED ORDER — METFORMIN HCL 1000 MG PO TABS: 1000.0000 mg | ORAL_TABLET | Freq: Two times a day (BID) | ORAL | 3 refills | Status: DC

## 2022-05-02 NOTE — Progress Notes (Signed)
[facility] [place], [place] [address]  Internal MEDICINE  Office Visit Note  Patient Name: Lawrence Yu  [id number]  [id number]  Date of Service: 05/13/2022  Chief Complaint  Patient presents with   Follow-up   Diabetes   Gastroesophageal Reflux   Hypertension   Medication Refill    Needs lots of medications refilled, patient wants to restart taking medication for Tryglicerides   Quality Metric Gaps    Colonoscopy and Foot Exam    HPI Pt is here for routine follow up -Only checking sugar once per week and is elevated 180-220. Will need to adjust medications. Continue jardiance, and increase metformin as he tolerates this well. 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 Reports TG high and wants to restart cholesterol medication. No record found for previous statin use and will start crestor, will refill vascepa. Once labs obtained, may need to consider carotid US for further evaluation -Foot exam done  Current Medication: Outpatient Encounter Medications as of 05/02/2022  Medication Sig   Choline Fenofibrate (FENOFIBRIC ACID) 135 MG CPDR TAKE 1 CAPSULE BY MOUTH  DAILY   LIFESCAN FINEPOINT LANCETS MISC Use to check blood sugar 2 time(s) daily.   Misc. Devices MISC by Does not apply route. cpap device   rosuvastatin (CRESTOR) 5 MG tablet Take 1 tablet (5 mg total) by mouth daily.   sildenafil (REVATIO) 20 MG tablet Take 1 tablet (20 mg total) by mouth daily as needed.   [DISCONTINUED] empagliflozin (JARDIANCE) 25 MG TABS tablet TAKE 1 TABLET(25 MG) BY MOUTH DAILY BEFORE BREAKFAST   [DISCONTINUED] icosapent Ethyl (VASCEPA) 1 g capsule Take 2 capsules (2 g total) by mouth 2 (two) times daily.   [DISCONTINUED] lisinopril (ZESTRIL) 5 MG tablet Take 1 tablet  (5 mg total) by mouth daily.   [DISCONTINUED] metFORMIN (GLUCOPHAGE) 1000 MG tablet Take 1 tablet (1,000 mg total) by mouth daily with breakfast.   [DISCONTINUED] omeprazole (PRILOSEC) 20 MG capsule Take 1 capsule (20 mg total) by mouth daily.   empagliflozin (JARDIANCE) 25 MG TABS tablet TAKE 1 TABLET(25 MG) BY MOUTH DAILY BEFORE BREAKFAST   icosapent Ethyl (VASCEPA) 1 g capsule Take 2 capsules (2 g total) by mouth 2 (two) times daily.   lisinopril (ZESTRIL) 5 MG tablet Take 1 tablet (5 mg total) by mouth daily.   metFORMIN (GLUCOPHAGE) 1000 MG tablet Take 1 tablet (1,000 mg total) by mouth 2 (two) times daily with a meal.   omeprazole (PRILOSEC) 20 MG capsule Take 1 capsule (20 mg total) by mouth daily.   No facility-administered encounter medications on file as of 05/02/2022.    Surgical History: Past Surgical History:  Procedure Laterality Date   APPENDECTOMY      Medical History: Past Medical History:  Diagnosis Date   Diabetes mellitus without complication ([place])    pre diabetic   GERD (gastroesophageal reflux disease)    Hypertension     Family History: Family History  Problem Relation Age of Onset   Diabetes Father    Kidney disease Father    Cancer Father    Dementia Maternal Grandfather    Dementia Paternal Grandmother     Social History   Socioeconomic History   Marital status: Married    Spouse name: Not on file   Number of  children: Not on file   Years of education: Not on file   Highest education level: Not on file  Occupational History   Not on file  Tobacco Use   Smoking status: Never   Smokeless tobacco: Never  Substance and Sexual Activity   Alcohol use: No   Drug use: No   Sexual activity: Not on file  Other Topics Concern   Not on file  Social History Narrative   Not on file   Social Determinants of Health   Financial Resource Strain: Not on file  Food Insecurity: Not on file  Transportation Needs: Not on file  Physical Activity: Not  on file  Stress: Not on file  Social Connections: Not on file  Intimate Partner Violence: Not on file      Review of Systems  Constitutional:  Negative for chills, fatigue and unexpected weight change.  HENT:  Negative for congestion, postnasal drip, rhinorrhea, sneezing and sore throat.   Eyes:  Negative for redness.  Respiratory:  Negative for cough, chest tightness and shortness of breath.   Cardiovascular:  Negative for chest pain and palpitations.  Gastrointestinal:  Negative for abdominal pain, constipation, diarrhea, nausea and vomiting.  Genitourinary:  Negative for dysuria and frequency.  Musculoskeletal:  Negative for arthralgias, back pain, joint swelling and neck pain.  Skin:  Negative for rash.  Neurological: Negative.  Negative for tremors and numbness.  Hematological:  Negative for adenopathy. Does not bruise/bleed easily.  Psychiatric/Behavioral:  Negative for behavioral problems (Depression), sleep disturbance and suicidal ideas. The patient is not nervous/anxious.     Vital Signs: BP 136/88 Comment: 144/80  Pulse 91   Temp 98.4 F (36.9 C)   Resp 16   Ht '5\' 11"'$  (1.803 m)   Wt 254 lb 3.2 oz (115.3 kg)   SpO2 98%   BMI 35.45 kg/m    Physical Exam Vitals and nursing note reviewed.  Constitutional:      General: He is not in acute distress.    Appearance: Normal appearance. He is well-developed. He is obese. He is not diaphoretic.  HENT:     Head: Normocephalic and atraumatic.     Nose: Nose normal.     Mouth/Throat:     Pharynx: No oropharyngeal exudate.  Eyes:     Pupils: Pupils are equal, round, and reactive to light.  Neck:     Thyroid: No thyromegaly.     Vascular: No carotid bruit or JVD.     Trachea: No tracheal deviation.  Cardiovascular:     Rate and Rhythm: Normal rate and regular rhythm.     Pulses:          Posterior tibial pulses are 2+ on the right side and 2+ on the left side.     Heart sounds: Normal heart sounds. No murmur  heard.    No friction rub. No gallop.  Pulmonary:     Effort: Pulmonary effort is normal. No respiratory distress.     Breath sounds: Normal breath sounds. No wheezing or rales.  Chest:     Chest wall: No tenderness.  Abdominal:     Palpations: Abdomen is soft.  Musculoskeletal:        General: Normal range of motion.     Cervical back: Normal range of motion and neck supple.  Feet:     Right foot:     Protective Sensation: 2 sites tested.  2 sites sensed.     Skin integrity: Skin integrity normal.  Toenail Condition: Fungal disease present.    Left foot:     Protective Sensation: 2 sites tested.  2 sites sensed.     Skin integrity: Skin integrity normal.     Toenail Condition: Fungal disease present. Lymphadenopathy:     Cervical: No cervical adenopathy.  Skin:    General: Skin is warm and dry.     Capillary Refill: Capillary refill takes less than 2 seconds.  Neurological:     Mental Status: He is alert and oriented to person, place, and time.     Cranial Nerves: No cranial nerve deficit.  Psychiatric:        Mood and Affect: Mood normal.        Behavior: Behavior normal.        Thought Content: Thought content normal.        Judgment: Judgment normal.        Assessment/Plan: 1. Type 2 diabetes mellitus with hyperglycemia, without long-term current use of insulin (HCC) - POCT HgB A1C is 7.7 which is stable from last visit. Will increase metformin to 1000 BID and continue Jardiance. Pt advised to call if any problems. Continue to work on diet and exercise - Urine Microalbumin w/creat. ratio - empagliflozin (JARDIANCE) 25 MG TABS tablet; TAKE 1 TABLET(25 MG) BY MOUTH DAILY BEFORE BREAKFAST  Dispense: 90 tablet; Refill: 1 - metFORMIN (GLUCOPHAGE) 1000 MG tablet; Take 1 tablet (1,000 mg total) by mouth 2 (two) times daily with a meal.  Dispense: 180 tablet; Refill: 3  2. Hypertension, unspecified type Stable, continue current medications - lisinopril (ZESTRIL) 5 MG  tablet; Take 1 tablet (5 mg total) by mouth daily.  Dispense: 90 tablet; Refill: 1  3. Mixed hyperlipidemia Restart vascepa and start crestor nightly. Pt will have labs sent to office done at dept physical - icosapent Ethyl (VASCEPA) 1 g capsule; Take 2 capsules (2 g total) by mouth 2 (two) times daily.  Dispense: 360 capsule; Refill: 1 - rosuvastatin (CRESTOR) 5 MG tablet; Take 1 tablet (5 mg total) by mouth daily.  Dispense: 90 tablet; Refill: 3  5. Gastroesophageal reflux disease without esophagitis - omeprazole (PRILOSEC) 20 MG capsule; Take 1 capsule (20 mg total) by mouth daily.  Dispense: 90 capsule; Refill: 3  6. Screening for colorectal cancer - Ambulatory referral to Gastroenterology   General Counseling: Rosina Lowenstein understanding of the findings of todays visit and agrees with plan of treatment. I have discussed any further diagnostic evaluation that may be needed or ordered today. We also reviewed his medications today. he has been encouraged to call the office with any questions or concerns that should arise related to todays visit.    Orders Placed This Encounter  Procedures   Urine Microalbumin w/creat. ratio   Ambulatory referral to Gastroenterology   POCT HgB A1C    Meds ordered this encounter  Medications   icosapent Ethyl (VASCEPA) 1 g capsule    Sig: Take 2 capsules (2 g total) by mouth 2 (two) times daily.    Dispense:  360 capsule    Refill:  1    Requesting 1 year supply   rosuvastatin (CRESTOR) 5 MG tablet    Sig: Take 1 tablet (5 mg total) by mouth daily.    Dispense:  90 tablet    Refill:  3   empagliflozin (JARDIANCE) 25 MG TABS tablet    Sig: TAKE 1 TABLET(25 MG) BY MOUTH DAILY BEFORE BREAKFAST    Dispense:  90 tablet    Refill:  1   metFORMIN (GLUCOPHAGE) 1000 MG tablet    Sig: Take 1 tablet (1,000 mg total) by mouth 2 (two) times daily with a meal.    Dispense:  180 tablet    Refill:  3   lisinopril (ZESTRIL) 5 MG tablet    Sig: Take 1  tablet (5 mg total) by mouth daily.    Dispense:  90 tablet    Refill:  1    Requesting 1 year supply   omeprazole (PRILOSEC) 20 MG capsule    Sig: Take 1 capsule (20 mg total) by mouth daily.    Dispense:  90 capsule    Refill:  3    Requesting 1 year supply    This patient was seen by Drema Dallas, [name] in collaboration with Dr. Clayborn Bigness as a part of collaborative care agreement.   Total time spent:30 Minutes Time spent includes review of chart, medications, test results, and follow up plan with the patient.      Dr Lavera Guise Internal medicine

## 2022-05-03 LAB — MICROALBUMIN / CREATININE URINE RATIO
Creatinine, Urine: 30.5 mg/dL
Microalb/Creat Ratio: <10 mg/g creat (ref 0–29)
Microalbumin, Urine: <3 ug/mL

## 2022-05-09 ENCOUNTER — Other Ambulatory Visit: Payer: Self-pay

## 2022-05-09 ENCOUNTER — Telehealth: Payer: Self-pay

## 2022-05-09 DIAGNOSIS — Z1211 Encounter for screening for malignant neoplasm of colon: Secondary | ICD-10-CM

## 2022-05-09 MED ORDER — NA SULFATE-K SULFATE-MG SULF 17.5-3.13-1.6 GM/177ML PO SOLN: 1.0000 | Freq: Once | ORAL | 0 refills | Status: AC

## 2022-05-09 NOTE — Telephone Encounter (Signed)
Gastroenterology Pre-Procedure Review  Request Date: 05/29/22 Requesting Physician: Dr. Vicente Males  PATIENT REVIEW QUESTIONS: The patient responded to the following health history questions as indicated:    1. Are you having any GI issues? no 2. Do you have a personal history of Polyps? no 3. Do you have a family history of Colon Cancer or Polyps? no 4. Diabetes Mellitus? yes (Jardiance 3 day hold, Metfornin 2 day hold) 5. Joint replacements in the past 12 months?no 6. Major health problems in the past 3 months?no 7. Any artificial heart valves, MVP, or defibrillator?no    MEDICATIONS & ALLERGIES:    Patient reports the following regarding taking any anticoagulation/antiplatelet therapy:   Plavix, Coumadin, Eliquis, Xarelto, Lovenox, Pradaxa, Brilinta, or Effient? no Aspirin? no  Patient confirms/reports the following medications:  Current Outpatient Medications  Medication Sig Dispense Refill   Choline Fenofibrate (FENOFIBRIC ACID) 135 MG CPDR TAKE 1 CAPSULE BY MOUTH  DAILY 90 capsule 3   empagliflozin (JARDIANCE) 25 MG TABS tablet TAKE 1 TABLET(25 MG) BY MOUTH DAILY BEFORE BREAKFAST 90 tablet 1   icosapent Ethyl (VASCEPA) 1 g capsule Take 2 capsules (2 g total) by mouth 2 (two) times daily. 360 capsule 1   LIFESCAN FINEPOINT LANCETS MISC Use to check blood sugar 2 time(s) daily.     lisinopril (ZESTRIL) 5 MG tablet Take 1 tablet (5 mg total) by mouth daily. 90 tablet 1   metFORMIN (GLUCOPHAGE) 1000 MG tablet Take 1 tablet (1,000 mg total) by mouth 2 (two) times daily with a meal. 180 tablet 3   Misc. Devices MISC by Does not apply route. cpap device     omeprazole (PRILOSEC) 20 MG capsule Take 1 capsule (20 mg total) by mouth daily. 90 capsule 3   rosuvastatin (CRESTOR) 5 MG tablet Take 1 tablet (5 mg total) by mouth daily. 90 tablet 3   sildenafil (REVATIO) 20 MG tablet Take 1 tablet (20 mg total) by mouth daily as needed. 30 tablet 3   No current facility-administered medications for  this visit.    Patient confirms/reports the following allergies:  No Known Allergies  No orders of the defined types were placed in this encounter.   AUTHORIZATION INFORMATION Primary Insurance: 1D#: Group #:  Secondary Insurance: 1D#: Group #:  SCHEDULE INFORMATION: Date: 05/29/22 Time: Location: [facility]

## 2022-05-15 ENCOUNTER — Encounter: Payer: Self-pay | Admitting: Physician Assistant

## 2022-05-21 DIAGNOSIS — E119 Type 2 diabetes mellitus without complications: Secondary | ICD-10-CM | POA: Diagnosis not present

## 2022-05-29 ENCOUNTER — Encounter
Admission: RE | Disposition: A | Discharge status: Home / Self Care | Payer: Self-pay | Source: Home / Self Care | Attending: Gastroenterology

## 2022-05-29 ENCOUNTER — Ambulatory Visit
Admission: RE | Admit: 2022-05-29 | Discharge: 2022-05-29 | Disposition: A | Discharge status: Home / Self Care | Payer: BC Managed Care – PPO | Attending: Gastroenterology | Admitting: Gastroenterology

## 2022-05-29 ENCOUNTER — Ambulatory Visit: Payer: BC Managed Care – PPO | Admitting: Certified Registered Nurse Anesthetist

## 2022-05-29 ENCOUNTER — Encounter: Payer: Self-pay | Admitting: Gastroenterology

## 2022-05-29 ENCOUNTER — Other Ambulatory Visit: Payer: Self-pay

## 2022-05-29 DIAGNOSIS — G4730 Sleep apnea, unspecified: Secondary | ICD-10-CM | POA: Insufficient documentation

## 2022-05-29 DIAGNOSIS — D122 Benign neoplasm of ascending colon: Secondary | ICD-10-CM | POA: Diagnosis not present

## 2022-05-29 DIAGNOSIS — Z833 Family history of diabetes mellitus: Secondary | ICD-10-CM | POA: Insufficient documentation

## 2022-05-29 DIAGNOSIS — D126 Benign neoplasm of colon, unspecified: Secondary | ICD-10-CM | POA: Diagnosis not present

## 2022-05-29 DIAGNOSIS — Z79899 Other long term (current) drug therapy: Secondary | ICD-10-CM | POA: Diagnosis not present

## 2022-05-29 DIAGNOSIS — Z7984 Long term (current) use of oral hypoglycemic drugs: Secondary | ICD-10-CM | POA: Diagnosis not present

## 2022-05-29 DIAGNOSIS — K635 Polyp of colon: Secondary | ICD-10-CM | POA: Diagnosis not present

## 2022-05-29 DIAGNOSIS — E119 Type 2 diabetes mellitus without complications: Secondary | ICD-10-CM | POA: Insufficient documentation

## 2022-05-29 DIAGNOSIS — Z1211 Encounter for screening for malignant neoplasm of colon: Secondary | ICD-10-CM

## 2022-05-29 DIAGNOSIS — I10 Essential (primary) hypertension: Secondary | ICD-10-CM | POA: Diagnosis not present

## 2022-05-29 DIAGNOSIS — D124 Benign neoplasm of descending colon: Secondary | ICD-10-CM | POA: Insufficient documentation

## 2022-05-29 DIAGNOSIS — Z9989 Dependence on other enabling machines and devices: Secondary | ICD-10-CM | POA: Diagnosis not present

## 2022-05-29 DIAGNOSIS — K219 Gastro-esophageal reflux disease without esophagitis: Secondary | ICD-10-CM | POA: Diagnosis not present

## 2022-05-29 DIAGNOSIS — G4733 Obstructive sleep apnea (adult) (pediatric): Secondary | ICD-10-CM | POA: Diagnosis not present

## 2022-05-29 HISTORY — PX: COLONOSCOPY WITH PROPOFOL: SHX5780

## 2022-05-29 LAB — GLUCOSE, CAPILLARY: Glucose-Capillary: 156 mg/dL — ABNORMAL HIGH (ref 70–99)

## 2022-05-29 SURGERY — COLONOSCOPY WITH PROPOFOL
Anesthesia: General

## 2022-05-29 MED ORDER — LIDOCAINE HCL (CARDIAC) PF 100 MG/5ML IV SOSY
PREFILLED_SYRINGE | INTRAVENOUS | Status: DC | PRN
  Administered 2022-05-29: 60 mg via INTRAVENOUS

## 2022-05-29 MED ORDER — PROPOFOL 500 MG/50ML IV EMUL
INTRAVENOUS | Status: DC | PRN
  Administered 2022-05-29: 140 ug/kg/min via INTRAVENOUS

## 2022-05-29 MED ORDER — STERILE WATER FOR IRRIGATION IR SOLN
Status: DC | PRN
  Administered 2022-05-29: 100 mL

## 2022-05-29 MED ORDER — SODIUM CHLORIDE 0.9 % IV SOLN: INTRAVENOUS | Status: DC

## 2022-05-29 MED ORDER — PROPOFOL 10 MG/ML IV BOLUS
INTRAVENOUS | Status: DC | PRN
  Administered 2022-05-29: 90 mg via INTRAVENOUS
  Administered 2022-05-29: 50 mg via INTRAVENOUS

## 2022-05-29 NOTE — Anesthesia Preprocedure Evaluation (Signed)
Anesthesia Evaluation  Patient identified by MRN, date of birth, ID band Patient awake    Reviewed: Allergy & Precautions, NPO status , Patient's Chart, lab work & pertinent test results  History of Anesthesia Complications Negative for: history of anesthetic complications  Airway Mallampati: III  TM Distance: >3 FB Neck ROM: Full    Dental no notable dental hx. (+) Teeth Intact   Pulmonary sleep apnea and Continuous Positive Airway Pressure Ventilation , neg COPD, Patient abstained from smoking.Not current smoker   Pulmonary exam normal breath sounds clear to auscultation       Cardiovascular Exercise Tolerance: Good METShypertension, Pt. on medications (-) CAD and (-) Past MI (-) dysrhythmias  Rhythm:Regular Rate:Normal - Systolic murmurs    Neuro/Psych negative neurological ROS  negative psych ROS   GI/Hepatic ,GERD  Medicated and Controlled,,(+)     (-) substance abuse    Endo/Other  diabetes, Well Controlled, Oral Hypoglycemic Agents    Renal/GU negative Renal ROS     Musculoskeletal   Abdominal  (+) + obese  Peds  Hematology   Anesthesia Other Findings Past Medical History: No date: Diabetes mellitus without complication (HCC)     Comment:  pre diabetic No date: GERD (gastroesophageal reflux disease) No date: Hypertension  Reproductive/Obstetrics                             Anesthesia Physical Anesthesia Plan  ASA: 2  Anesthesia Plan: General   Post-op Pain Management: Minimal or no pain anticipated   Induction: Intravenous  PONV Risk Score and Plan: 2 and Propofol infusion, TIVA and Ondansetron  Airway Management Planned: Nasal Cannula  Additional Equipment: None  Intra-op Plan:   Post-operative Plan:   Informed Consent: I have reviewed the patients History and Physical, chart, labs and discussed the procedure including the risks, benefits and alternatives for  the proposed anesthesia with the patient or authorized representative who has indicated his/her understanding and acceptance.     Dental advisory given  Plan Discussed with: CRNA and Surgeon  Anesthesia Plan Comments: (Discussed risks of anesthesia with patient, including possibility of difficulty with spontaneous ventilation under anesthesia necessitating airway intervention, PONV, and rare risks such as cardiac or respiratory or neurological events, and allergic reactions. Discussed the role of CRNA in patient's perioperative care. Patient understands.)       Anesthesia Quick Evaluation

## 2022-05-29 NOTE — Op Note (Signed)
[facility] Gastroenterology Patient Name: Lawrence Yu Procedure Date: 05/29/2022 11:37 AM MRN: [id number] Account #: [id number] Date of Birth: [birth year] Admit Type: Outpatient Age: 51 Room: [facility] ENDO ROOM 4 Gender: Male Note Status: Finalized Instrument Name: Jasper Riling [id number] Procedure:             Colonoscopy Indications:           Screening for colorectal malignant neoplasm Providers:             Jonathon Bellows MD, MD Referring MD:          Mylinda Latina (Referring MD) Medicines:             Monitored Anesthesia Care Complications:         No immediate complications. Procedure:             Pre-Anesthesia Assessment:                        - Prior to the procedure, a History and Physical was                         performed, and patient medications, allergies and                         sensitivities were reviewed. The patient's tolerance                         of previous anesthesia was reviewed.                        - The risks and benefits of the procedure and the                         sedation options and risks were discussed with the                         patient. All questions were answered and informed                         consent was obtained.                        - ASA Grade Assessment: II - A patient with mild                         systemic disease.                        After obtaining informed consent, the colonoscope was                         passed under direct vision. Throughout the procedure,                         the patient's blood pressure, pulse, and oxygen                         saturations were monitored continuously. The                         Colonoscope was introduced  through the anus and                         advanced to the the cecum, identified by the                         appendiceal orifice. The colonoscopy was performed                         with ease. The patient tolerated the procedure well.                          The quality of the bowel preparation was excellent. Findings:      The perianal and digital rectal examinations were normal.      Two sessile polyps were found in the sigmoid colon. The polyps were 4 to       5 mm in size. These polyps were removed with a cold snare. Resection and       retrieval were complete.      Two sessile polyps were found in the descending colon. The polyps were 4       to 5 mm in size. These polyps were removed with a cold snare. Resection       and retrieval were complete.      A 4 mm polyp was found in the cecum. The polyp was sessile. The polyp       was removed with a cold snare. Resection and retrieval were complete.      Two sessile polyps were found in the ascending colon. The polyps were 4       to 5 mm in size. These polyps were removed with a cold snare. Resection       and retrieval were complete.      The exam was otherwise without abnormality on direct and retroflexion       views. Impression:            - Two 4 to 5 mm polyps in the sigmoid colon, removed                         with a cold snare. Resected and retrieved.                        - Two 4 to 5 mm polyps in the descending colon,                         removed with a cold snare. Resected and retrieved.                        - One 4 mm polyp in the cecum, removed with a cold                         snare. Resected and retrieved.                        - Two 4 to 5 mm polyps in the ascending colon, removed                         with a cold snare. Resected and retrieved.                        -  The examination was otherwise normal on direct and                         retroflexion views. Recommendation:        - Discharge patient to home (with escort).                        - Resume previous diet.                        - Continue present medications.                        - Await pathology results.                        - Repeat colonoscopy for  surveillance. Procedure Code(s):     --- Professional ---                        [id number], Colonoscopy, flexible; with removal of                         tumor(s), polyp(s), or other lesion(s) by snare                         technique Diagnosis Code(s):     --- Professional ---                        Z12.11, Encounter for screening for malignant neoplasm                         of colon                        D12.5, Benign neoplasm of sigmoid colon                        D12.4, Benign neoplasm of descending colon                        D12.2, Benign neoplasm of ascending colon                        D12.0, Benign neoplasm of cecum CPT copyright 2022 [employer]. All rights reserved. The codes documented in this report are preliminary and upon coder review may  be revised to meet current compliance requirements. Jonathon Bellows, MD Jonathon Bellows MD, MD 05/29/2022 12:09:51 PM This report has been signed electronically. Number of Addenda: 0 Note Initiated On: 05/29/2022 11:37 AM Scope Withdrawal Time: 0 hours 16 minutes 6 seconds  Total Procedure Duration: 0 hours 20 minutes 1 second  Estimated Blood Loss:  Estimated blood loss: none.      [facility]

## 2022-05-29 NOTE — Transfer of Care (Signed)
Immediate Anesthesia Transfer of Care Note  Patient: Lawrence Yu  Procedure(s) Performed: COLONOSCOPY WITH PROPOFOL  Patient Location: Endoscopy Unit  Anesthesia Type:General  Level of Consciousness: awake, alert , and oriented  Airway & Oxygen Therapy: Patient Spontanous Breathing  Post-op Assessment: Report given to RN and Post -op Vital signs reviewed and stable  Post vital signs: Reviewed and stable  Last Vitals:  Vitals Value Taken Time  BP 98/73   Temp    Pulse 79   Resp 17 05/29/22 1212  SpO2 98   Vitals shown include unvalidated device data.  Last Pain:  Vitals:   05/29/22 1041  TempSrc: Temporal  PainSc: 0-No pain         Complications: No notable events documented.

## 2022-05-29 NOTE — H&P (Signed)
Jonathon Bellows, MD [address] [place], [place], [place], [address] [address], [place], [place], [place], [address] Phone: [phone]  Fax: [phone]  Primary Care Physician:  Mylinda Latina, [name]   Pre-Procedure History & Physical: HPI:  Lawrence Yu is a 51 y.o. male is here for an colonoscopy.   Past Medical History:  Diagnosis Date   Diabetes mellitus without complication ([place])    pre diabetic   GERD (gastroesophageal reflux disease)    Hypertension     Past Surgical History:  Procedure Laterality Date   APPENDECTOMY      Prior to Admission medications   Medication Sig Start Date End Date Taking? Authorizing Provider  Choline Fenofibrate (FENOFIBRIC ACID) 135 MG CPDR TAKE 1 CAPSULE BY MOUTH  DAILY 02/28/21   Lavera Guise, MD  empagliflozin (JARDIANCE) 25 MG TABS tablet TAKE 1 TABLET(25 MG) BY MOUTH DAILY BEFORE BREAKFAST 05/02/22   McDonough, Si Gaul, [name]  icosapent Ethyl (VASCEPA) 1 g capsule Take 2 capsules (2 g total) by mouth 2 (two) times daily. 05/02/22   McDonough, Si Gaul, [name]  LIFESCAN FINEPOINT LANCETS MISC Use to check blood sugar 2 time(s) daily. 09/03/17   [provider]  lisinopril (ZESTRIL) 5 MG tablet Take 1 tablet (5 mg total) by mouth daily. 05/02/22   McDonough, Si Gaul, [name]  metFORMIN (GLUCOPHAGE) 1000 MG tablet Take 1 tablet (1,000 mg total) by mouth 2 (two) times daily with a meal. 05/02/22   McDonough, Si Gaul, [name]  Misc. Devices MISC by Does not apply route. cpap device    [provider]  omeprazole (PRILOSEC) 20 MG capsule Take 1 capsule (20 mg total) by mouth daily. 05/02/22   McDonough, Si Gaul, [name]  rosuvastatin (CRESTOR) 5 MG tablet Take 1 tablet (5 mg total) by mouth daily. 05/02/22   McDonough, Si Gaul, [name]  sildenafil (REVATIO) 20 MG tablet Take 1 tablet (20 mg total) by mouth daily as needed. 06/27/21   McDonough, Si Gaul, [name]    Allergies as of 05/09/2022   (No Known Allergies)     Family History  Problem Relation Age of Onset   Diabetes Father    Kidney disease Father    Cancer Father    Dementia Maternal Grandfather    Dementia Paternal Grandmother     Social History   Socioeconomic History   Marital status: Married    Spouse name: Not on file   Number of children: Not on file   Years of education: Not on file   Highest education level: Not on file  Occupational History   Not on file  Tobacco Use   Smoking status: Never   Smokeless tobacco: Never  Substance and Sexual Activity   Alcohol use: No   Drug use: No   Sexual activity: Not on file  Other Topics Concern   Not on file  Social History Narrative   Not on file   Social Determinants of Health   Financial Resource Strain: Not on file  Food Insecurity: Not on file  Transportation Needs: Not on file  Physical Activity: Not on file  Stress: Not on file  Social Connections: Not on file  Intimate Partner Violence: Not on file    Review of Systems: See HPI, otherwise negative ROS  Physical Exam: BP (!) 147/83   Pulse 81   Temp 97.8 F (36.6 C) (Temporal)   Resp 18   Ht '5\' 11"'$  (1.803 m)   Wt 114.8  kg   SpO2 100%   BMI 35.29 kg/m  General:   Alert,  pleasant and cooperative in NAD Head:  Normocephalic and atraumatic. Neck:  Supple; no masses or thyromegaly. Lungs:  Clear throughout to auscultation, normal respiratory effort.    Heart:  +S1, +S2, Regular rate and rhythm, No edema. Abdomen:  Soft, nontender and nondistended. Normal bowel sounds, without guarding, and without rebound.   Neurologic:  Alert and  oriented x4;  grossly normal neurologically.  Impression/Plan: Lawrence Yu is here for an colonoscopy to be performed for Screening colonoscopy average risk   Risks, benefits, limitations, and alternatives regarding  colonoscopy have been reviewed with the patient.  Questions have been answered.  All parties agreeable.   Jonathon Bellows, MD  05/29/2022, 11:34 AM

## 2022-05-29 NOTE — Anesthesia Procedure Notes (Signed)
Date/Time: 05/29/2022 11:58 AM  Performed by: Lily Peer, Maikol Grassia, CRNAPre-anesthesia Checklist: Patient identified, Emergency Drugs available, Suction available, Patient being monitored and Timeout performed Patient Re-evaluated:Patient Re-evaluated prior to induction Oxygen Delivery Method: Simple face mask Induction Type: IV induction

## 2022-05-29 NOTE — Anesthesia Postprocedure Evaluation (Signed)
Anesthesia Post Note  Patient: Lawrence Yu  Procedure(s) Performed: COLONOSCOPY WITH PROPOFOL  Patient location during evaluation: Endoscopy Anesthesia Type: General Level of consciousness: awake and alert Pain management: pain level controlled Vital Signs Assessment: post-procedure vital signs reviewed and stable Respiratory status: spontaneous breathing, nonlabored ventilation, respiratory function stable and patient connected to nasal cannula oxygen Cardiovascular status: blood pressure returned to baseline and stable Postop Assessment: no apparent nausea or vomiting Anesthetic complications: no   No notable events documented.   Last Vitals:  Vitals:   05/29/22 1224 05/29/22 1233  BP: 128/81 136/85  Pulse: 73 74  Resp: 18 20  Temp:    SpO2: 97% 98%    Last Pain:  Vitals:   05/29/22 1233  TempSrc:   PainSc: 0-No pain                 Arita Miss

## 2022-05-30 ENCOUNTER — Encounter: Payer: Self-pay | Admitting: Gastroenterology

## 2022-05-30 LAB — SURGICAL PATHOLOGY

## 2022-05-30 NOTE — H&P (Signed)
Jonathon Bellows, MD [address] [place], [place], [place], [address] [address], [place], [place], [place], [address] Phone: [phone]  Fax: [phone]  Primary Care Physician:  Mylinda Latina, [name]   Pre-Procedure History & Physical: HPI:  Lawrence Yu is a 51 y.o. male is here for an colonoscopy.   Past Medical History:  Diagnosis Date   Diabetes mellitus without complication ([place])    pre diabetic   GERD (gastroesophageal reflux disease)    Hypertension     Past Surgical History:  Procedure Laterality Date   APPENDECTOMY      Prior to Admission medications   Medication Sig Start Date End Date Taking? Authorizing Provider  Choline Fenofibrate (FENOFIBRIC ACID) 135 MG CPDR TAKE 1 CAPSULE BY MOUTH  DAILY 02/28/21   Lavera Guise, MD  empagliflozin (JARDIANCE) 25 MG TABS tablet TAKE 1 TABLET(25 MG) BY MOUTH DAILY BEFORE BREAKFAST 05/02/22   McDonough, Si Gaul, [name]  icosapent Ethyl (VASCEPA) 1 g capsule Take 2 capsules (2 g total) by mouth 2 (two) times daily. 05/02/22   McDonough, Si Gaul, [name]  LIFESCAN FINEPOINT LANCETS MISC Use to check blood sugar 2 time(s) daily. 09/03/17   [provider]  lisinopril (ZESTRIL) 5 MG tablet Take 1 tablet (5 mg total) by mouth daily. 05/02/22   McDonough, Si Gaul, [name]  metFORMIN (GLUCOPHAGE) 1000 MG tablet Take 1 tablet (1,000 mg total) by mouth 2 (two) times daily with a meal. 05/02/22   McDonough, Si Gaul, [name]  Misc. Devices MISC by Does not apply route. cpap device    [provider]  omeprazole (PRILOSEC) 20 MG capsule Take 1 capsule (20 mg total) by mouth daily. 05/02/22   McDonough, Si Gaul, [name]  rosuvastatin (CRESTOR) 5 MG tablet Take 1 tablet (5 mg total) by mouth daily. 05/02/22   McDonough, Si Gaul, [name]  sildenafil (REVATIO) 20 MG tablet Take 1 tablet (20 mg total) by mouth daily as needed. 06/27/21   McDonough, Si Gaul, [name]    Allergies as of 05/09/2022   (No Known Allergies)     Family History  Problem Relation Age of Onset   Diabetes Father    Kidney disease Father    Cancer Father    Dementia Maternal Grandfather    Dementia Paternal Grandmother     Social History   Socioeconomic History   Marital status: Married    Spouse name: Not on file   Number of children: Not on file   Years of education: Not on file   Highest education level: Not on file  Occupational History   Not on file  Tobacco Use   Smoking status: Never   Smokeless tobacco: Never  Substance and Sexual Activity   Alcohol use: No   Drug use: No   Sexual activity: Not on file  Other Topics Concern   Not on file  Social History Narrative   Not on file   Social Determinants of Health   Financial Resource Strain: Not on file  Food Insecurity: Not on file  Transportation Needs: Not on file  Physical Activity: Not on file  Stress: Not on file  Social Connections: Not on file  Intimate Partner Violence: Not on file    Review of Systems: See HPI, otherwise negative ROS  Physical Exam: BP 136/85   Pulse 74   Temp (!) 96.5 F (35.8 C) (Temporal)   Resp 20   Ht '5\' 11"'$  (1.803 m)   Wt 114.8  kg   SpO2 98%   BMI 35.29 kg/m  General:   Alert,  pleasant and cooperative in NAD Head:  Normocephalic and atraumatic. Neck:  Supple; no masses or thyromegaly. Lungs:  Clear throughout to auscultation, normal respiratory effort.    Heart:  +S1, +S2, Regular rate and rhythm, No edema. Abdomen:  Soft, nontender and nondistended. Normal bowel sounds, without guarding, and without rebound.   Neurologic:  Alert and  oriented x4;  grossly normal neurologically.  Impression/Plan: Lawrence Yu is here for an colonoscopy to be performed for Screening colonoscopy average risk   Risks, benefits, limitations, and alternatives regarding  colonoscopy have been reviewed with the patient.  Questions have been answered.  All parties agreeable.   Jonathon Bellows, MD  05/30/2022, 7:46 AM

## 2022-06-04 ENCOUNTER — Other Ambulatory Visit: Payer: Self-pay | Admitting: Physician Assistant

## 2022-06-04 ENCOUNTER — Encounter: Payer: Self-pay | Admitting: Gastroenterology

## 2022-06-04 ENCOUNTER — Telehealth: Payer: Self-pay

## 2022-06-04 MED ORDER — COLCHICINE 0.6 MG PO TABS: ORAL_TABLET | ORAL | 1 refills | Status: AC

## 2022-06-04 NOTE — Telephone Encounter (Signed)
Also I saw on IMS he was on allopurinol

## 2022-06-05 NOTE — Telephone Encounter (Signed)
Pt advised that we send message

## 2022-06-20 ENCOUNTER — Encounter: Payer: BC Managed Care – PPO | Admitting: Physician Assistant

## 2022-06-20 DIAGNOSIS — E119 Type 2 diabetes mellitus without complications: Secondary | ICD-10-CM | POA: Diagnosis not present

## 2022-07-08 ENCOUNTER — Other Ambulatory Visit: Payer: Self-pay | Admitting: Physician Assistant

## 2022-07-08 ENCOUNTER — Encounter: Payer: Self-pay | Admitting: Physician Assistant

## 2022-07-08 ENCOUNTER — Other Ambulatory Visit: Payer: Self-pay

## 2022-07-08 DIAGNOSIS — N529 Male erectile dysfunction, unspecified: Secondary | ICD-10-CM

## 2022-07-08 MED ORDER — SILDENAFIL CITRATE 20 MG PO TABS: 20.0000 mg | ORAL_TABLET | Freq: Every day | ORAL | 0 refills | Status: DC | PRN

## 2022-07-09 ENCOUNTER — Telehealth: Payer: Self-pay | Admitting: Physician Assistant

## 2022-07-09 NOTE — Telephone Encounter (Signed)
Lvm to reschedule 08/01/22 appointment-Toni

## 2022-07-21 DIAGNOSIS — E119 Type 2 diabetes mellitus without complications: Secondary | ICD-10-CM | POA: Diagnosis not present

## 2022-08-01 ENCOUNTER — Ambulatory Visit: Payer: BC Managed Care – PPO | Admitting: Physician Assistant

## 2022-08-14 ENCOUNTER — Encounter: Payer: Self-pay | Admitting: Physician Assistant

## 2022-08-14 ENCOUNTER — Ambulatory Visit: Payer: BC Managed Care – PPO | Admitting: Physician Assistant

## 2022-08-14 VITALS — BP 139/78 | HR 91 | Temp 98.3°F | Resp 16 | Ht 71.0 in | Wt 259.4 lb

## 2022-08-14 DIAGNOSIS — E1165 Type 2 diabetes mellitus with hyperglycemia: Secondary | ICD-10-CM | POA: Diagnosis not present

## 2022-08-14 DIAGNOSIS — E782 Mixed hyperlipidemia: Secondary | ICD-10-CM | POA: Diagnosis not present

## 2022-08-14 DIAGNOSIS — R5383 Other fatigue: Secondary | ICD-10-CM

## 2022-08-14 DIAGNOSIS — N529 Male erectile dysfunction, unspecified: Secondary | ICD-10-CM | POA: Diagnosis not present

## 2022-08-14 DIAGNOSIS — I10 Essential (primary) hypertension: Secondary | ICD-10-CM | POA: Diagnosis not present

## 2022-08-14 LAB — POCT GLYCOSYLATED HEMOGLOBIN (HGB A1C): Hemoglobin A1C: 7.9 % — AB (ref 4.0–5.6)

## 2022-08-14 MED ORDER — SITAGLIPTIN PHOS-METFORMIN HCL 50-1000 MG PO TABS: 1.0000 | ORAL_TABLET | Freq: Two times a day (BID) | ORAL | 2 refills | Status: DC

## 2022-08-14 MED ORDER — SILDENAFIL CITRATE 20 MG PO TABS: 20.0000 mg | ORAL_TABLET | Freq: Every day | ORAL | 0 refills | Status: DC | PRN

## 2022-08-14 NOTE — Progress Notes (Signed)
[facility] [place], [place] [address]  Internal MEDICINE  Office Visit Note  Patient Name: Lawrence Yu  [id number]  [id number]  Date of Service: 08/14/2022  Chief Complaint  Patient presents with   Follow-up   Diabetes   Gastroesophageal Reflux   Hypertension    HPI Pt is here for routine follow up -Admits he has not checked BG since last visit, has been taking his medications and tolerating well -Jardiance daily and '1000mg'$  metformin BID. Discussed need for better control and monitoring. He really does not want to add another medication and will try to improve diet/exercise. He is ok with adjusting meds from metformin to janumet to add med without adding to number of pills taken. Will try this and continue Jardiance. He reports previous S/E to GLP1 and therefore does not want to try another injectable. -Would like to recheck cholesterol since it has been high on last physical and will add thyroid since this isnt checked at his annual FD physical  Current Medication: Outpatient Encounter Medications as of 08/14/2022  Medication Sig   Choline Fenofibrate (FENOFIBRIC ACID) 135 MG CPDR TAKE 1 CAPSULE BY MOUTH  DAILY   colchicine 0.6 MG tablet Take 2 tablets (1.'2mg'$ ) at start of flare and then 1 tablet 1 hour later.   empagliflozin (JARDIANCE) 25 MG TABS tablet TAKE 1 TABLET(25 MG) BY MOUTH DAILY BEFORE BREAKFAST   icosapent Ethyl (VASCEPA) 1 g capsule Take 2 capsules (2 g total) by mouth 2 (two) times daily.   LIFESCAN FINEPOINT LANCETS MISC Use to check blood sugar 2 time(s) daily.   lisinopril (ZESTRIL) 5 MG tablet Take 1 tablet (5 mg total) by mouth daily.   Misc. Devices MISC by Does not apply route. cpap device   omeprazole (PRILOSEC) 20 MG capsule Take 1 capsule (20 mg total) by mouth daily.   rosuvastatin (CRESTOR) 5 MG tablet Take 1 tablet (5 mg total) by mouth daily.   [DISCONTINUED] metFORMIN (GLUCOPHAGE) 1000 MG tablet Take 1 tablet (1,000 mg  total) by mouth 2 (two) times daily with a meal.   [DISCONTINUED] sildenafil (REVATIO) 20 MG tablet Take 1 tablet (20 mg total) by mouth daily as needed.   [DISCONTINUED] sitaGLIPtin-metformin (JANUMET) 50-1000 MG tablet Take 1 tablet by mouth 2 (two) times daily with a meal.   sildenafil (REVATIO) 20 MG tablet Take 1 tablet (20 mg total) by mouth daily as needed.   sitaGLIPtin-metformin (JANUMET) 50-1000 MG tablet Take 1 tablet by mouth 2 (two) times daily with a meal.   No facility-administered encounter medications on file as of 08/14/2022.    Surgical History: Past Surgical History:  Procedure Laterality Date   APPENDECTOMY     COLONOSCOPY WITH PROPOFOL N/A 05/29/2022   Procedure: COLONOSCOPY WITH PROPOFOL;  Surgeon: Jonathon Bellows, MD;  Location: [facility] ENDOSCOPY;  Service: Gastroenterology;  Laterality: N/A;    Medical History: Past Medical History:  Diagnosis Date   Diabetes mellitus without complication ([place])    pre diabetic   GERD (gastroesophageal reflux disease)    Hypertension     Family History: Family History  Problem Relation Age of Onset   Diabetes Father    Kidney disease Father    Cancer Father    Dementia Maternal Grandfather    Dementia Paternal Grandmother     Social History   Socioeconomic History   Marital status: Married    Spouse name: Not on file   Number of children: Not on file   Years of  education: Not on file   Highest education level: Not on file  Occupational History   Not on file  Tobacco Use   Smoking status: Never   Smokeless tobacco: Never  Substance and Sexual Activity   Alcohol use: No   Drug use: No   Sexual activity: Not on file  Other Topics Concern   Not on file  Social History Narrative   Not on file   Social Determinants of Health   Financial Resource Strain: Not on file  Food Insecurity: Not on file  Transportation Needs: Not on file  Physical Activity: Not on file  Stress: Not on file  Social Connections: Not on  file  Intimate Partner Violence: Not on file      Review of Systems  Constitutional:  Negative for chills, fatigue and unexpected weight change.  HENT:  Negative for congestion, postnasal drip, rhinorrhea, sneezing and sore throat.   Eyes:  Negative for redness.  Respiratory:  Negative for cough, chest tightness and shortness of breath.   Cardiovascular:  Negative for chest pain and palpitations.  Gastrointestinal:  Negative for abdominal pain, constipation, diarrhea, nausea and vomiting.  Genitourinary:  Negative for dysuria and frequency.  Musculoskeletal:  Negative for arthralgias, back pain, joint swelling and neck pain.  Skin:  Negative for rash.  Neurological: Negative.  Negative for tremors and numbness.  Hematological:  Negative for adenopathy. Does not bruise/bleed easily.  Psychiatric/Behavioral:  Negative for behavioral problems (Depression), sleep disturbance and suicidal ideas. The patient is not nervous/anxious.     Vital Signs: BP 139/78   Pulse 91   Temp 98.3 F (36.8 C)   Resp 16   Ht '5\' 11"'$  (1.803 m)   Wt 259 lb 6.4 oz (117.7 kg)   SpO2 98%   BMI 36.18 kg/m    Physical Exam Vitals and nursing note reviewed.  Constitutional:      General: He is not in acute distress.    Appearance: Normal appearance. He is well-developed. He is obese. He is not diaphoretic.  HENT:     Head: Normocephalic and atraumatic.     Nose: Nose normal.     Mouth/Throat:     Pharynx: No oropharyngeal exudate.  Eyes:     Pupils: Pupils are equal, round, and reactive to light.  Neck:     Thyroid: No thyromegaly.     Vascular: No carotid bruit or JVD.     Trachea: No tracheal deviation.  Cardiovascular:     Rate and Rhythm: Normal rate and regular rhythm.     Heart sounds: Normal heart sounds. No murmur heard.    No friction rub. No gallop.  Pulmonary:     Effort: Pulmonary effort is normal. No respiratory distress.     Breath sounds: Normal breath sounds. No wheezing or  rales.  Chest:     Chest wall: No tenderness.  Abdominal:     Palpations: Abdomen is soft.  Musculoskeletal:        General: Normal range of motion.     Cervical back: Normal range of motion and neck supple.  Lymphadenopathy:     Cervical: No cervical adenopathy.  Skin:    General: Skin is warm and dry.     Capillary Refill: Capillary refill takes less than 2 seconds.  Neurological:     Mental Status: He is alert and oriented to person, place, and time.     Cranial Nerves: No cranial nerve deficit.  Psychiatric:  Mood and Affect: Mood normal.        Behavior: Behavior normal.        Thought Content: Thought content normal.        Judgment: Judgment normal.        Assessment/Plan: 1. Type 2 diabetes mellitus with hyperglycemia, without long-term current use of insulin (HCC) - POCT HgB A1C is 7.9 which is increased from 7.7 last visit. Will stop metformin and switch to janumet combo pill. Continue jardiance. Pt advised to check BG at home and contact office if any problems. If not improving will need to adjust meds further next visit. - sitaGLIPtin-metformin (JANUMET) 50-1000 MG tablet; Take 1 tablet by mouth 2 (two) times daily with a meal.  Dispense: 60 tablet; Refill: 2  2. Hypertension, unspecified type Stable, continue current medication  3. Erectile dysfunction, unspecified erectile dysfunction type - sildenafil (REVATIO) 20 MG tablet; Take 1 tablet (20 mg total) by mouth daily as needed.  Dispense: 30 tablet; Refill: 0  4. Mixed hyperlipidemia Continue current medications and will update labs - Lipid Panel With LDL/HDL Ratio  5. Other fatigue - TSH + free T4   General Counseling: Kanaan verbalizes understanding of the findings of todays visit and agrees with plan of treatment. I have discussed any further diagnostic evaluation that may be needed or ordered today. We also reviewed his medications today. he has been encouraged to call the office with any questions  or concerns that should arise related to todays visit.    Orders Placed This Encounter  Procedures   Lipid Panel With LDL/HDL Ratio   TSH + free T4   POCT HgB A1C    Meds ordered this encounter  Medications   DISCONTD: sitaGLIPtin-metformin (JANUMET) 50-1000 MG tablet    Sig: Take 1 tablet by mouth 2 (two) times daily with a meal.    Dispense:  30 tablet    Refill:  2   sildenafil (REVATIO) 20 MG tablet    Sig: Take 1 tablet (20 mg total) by mouth daily as needed.    Dispense:  30 tablet    Refill:  0   sitaGLIPtin-metformin (JANUMET) 50-1000 MG tablet    Sig: Take 1 tablet by mouth 2 (two) times daily with a meal.    Dispense:  60 tablet    Refill:  2    This patient was seen by Drema Dallas, [name] in collaboration with Dr. Clayborn Bigness as a part of collaborative care agreement.   Total time spent:30 Minutes Time spent includes review of chart, medications, test results, and follow up plan with the patient.      Dr Lavera Guise Internal medicine

## 2022-08-21 DIAGNOSIS — E119 Type 2 diabetes mellitus without complications: Secondary | ICD-10-CM | POA: Diagnosis not present

## 2022-09-19 DIAGNOSIS — E119 Type 2 diabetes mellitus without complications: Secondary | ICD-10-CM | POA: Diagnosis not present

## 2022-11-20 ENCOUNTER — Ambulatory Visit: Payer: BC Managed Care – PPO | Admitting: Physician Assistant

## 2022-12-08 ENCOUNTER — Ambulatory Visit: Payer: BC Managed Care – PPO | Admitting: Physician Assistant

## 2022-12-08 ENCOUNTER — Encounter: Payer: Self-pay | Admitting: Physician Assistant

## 2022-12-08 VITALS — BP 140/80 | HR 109 | Temp 98.4°F | Resp 16 | Ht 71.0 in | Wt 259.0 lb

## 2022-12-08 DIAGNOSIS — E1165 Type 2 diabetes mellitus with hyperglycemia: Secondary | ICD-10-CM | POA: Diagnosis not present

## 2022-12-08 DIAGNOSIS — E782 Mixed hyperlipidemia: Secondary | ICD-10-CM

## 2022-12-08 DIAGNOSIS — I10 Essential (primary) hypertension: Secondary | ICD-10-CM

## 2022-12-08 LAB — POCT GLYCOSYLATED HEMOGLOBIN (HGB A1C): Hemoglobin A1C: 7.4 % — AB (ref 4.0–5.6)

## 2022-12-08 MED ORDER — EMPAGLIFLOZIN 25 MG PO TABS: ORAL_TABLET | ORAL | 1 refills | Status: DC

## 2022-12-08 MED ORDER — LISINOPRIL 5 MG PO TABS: 5.0000 mg | ORAL_TABLET | Freq: Every day | ORAL | 1 refills | Status: DC

## 2022-12-08 MED ORDER — LISINOPRIL 10 MG PO TABS: 10.0000 mg | ORAL_TABLET | Freq: Every day | ORAL | 1 refills | Status: DC

## 2022-12-08 NOTE — Progress Notes (Signed)
[facility] [address] [place], [place] [address]  Internal MEDICINE  Office Visit Note  Patient Name: Lawrence Yu  [id number]  [id number]  Date of Service: 12/08/2022  Chief Complaint  Patient presents with   Follow-up   Diabetes   Gastroesophageal Reflux   Hypertension    HPI Pt is here for routine follow up -Not checking sugars or BP at home regularly -Doing well with Janumet and jardiance, A1c is improving and will keep working on diet and exercise -Takes lisinopril in evening. BP did not improve on recheck and has been near borderline for a little while. -Will monitor at home while increasing to 10mg  lisinopril. 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   icosapent Ethyl (VASCEPA) 1 g capsule Take 2 capsules (2 g total) by mouth 2 (two) times daily.   LIFESCAN FINEPOINT LANCETS MISC Use to check blood sugar 2 time(s) daily.   lisinopril (ZESTRIL) 10 MG tablet Take 1 tablet (10 mg total) by mouth daily.   Misc. Devices MISC by Does not apply route. cpap device   omeprazole (PRILOSEC) 20 MG capsule Take 1 capsule (20 mg total) by mouth daily.   rosuvastatin (CRESTOR) 5 MG tablet Take 1 tablet (5 mg total) by mouth daily.   sildenafil (REVATIO) 20 MG tablet Take 1 tablet (20 mg total) by mouth daily as needed.   sitaGLIPtin-metformin (JANUMET) 50-1000 MG tablet Take 1 tablet by mouth 2  (two) times daily with a meal.   [DISCONTINUED] Choline Fenofibrate (FENOFIBRIC ACID) 135 MG CPDR TAKE 1 CAPSULE BY MOUTH  DAILY   [DISCONTINUED] empagliflozin (JARDIANCE) 25 MG TABS tablet TAKE 1 TABLET(25 MG) BY MOUTH DAILY BEFORE BREAKFAST   [DISCONTINUED] lisinopril (ZESTRIL) 5 MG tablet Take 1 tablet (5 mg total) by mouth daily.   empagliflozin (JARDIANCE) 25 MG TABS tablet TAKE 1 TABLET(25 MG) BY MOUTH DAILY BEFORE BREAKFAST   [DISCONTINUED] lisinopril (ZESTRIL) 5 MG tablet Take 1 tablet (5 mg total) by mouth daily.   No facility-administered encounter medications on file as of 12/08/2022.    Surgical History: Past Surgical History:  Procedure Laterality Date   APPENDECTOMY     COLONOSCOPY WITH PROPOFOL N/A 05/29/2022   Procedure: COLONOSCOPY WITH PROPOFOL;  Surgeon: Wyline Mood, MD;  Location: [facility] ENDOSCOPY;  Service: Gastroenterology;  Laterality: N/A;    Medical History: Past Medical History:  Diagnosis Date   Diabetes mellitus without complication (HCC)    pre diabetic   GERD (gastroesophageal reflux disease)    Hypertension     Family History: Family History  Problem Relation Age of Onset   Diabetes Father    Kidney disease Father    Cancer Father    Dementia Maternal Grandfather    Dementia Paternal Grandmother     Social History   Socioeconomic History   Marital status: Married    Spouse name: Not on file  Number of children: Not on file   Years of education: Not on file   Highest education level: Not on file  Occupational History   Not on file  Tobacco Use   Smoking status: Never   Smokeless tobacco: Never  Substance and Sexual Activity   Alcohol use: No   Drug use: No   Sexual activity: Not on file  Other Topics Concern   Not on file  Social History Narrative   Not on file   Social Determinants of Health   Financial Resource Strain: Not on file  Food Insecurity: Not on file  Transportation Needs: Not on file  Physical Activity: Not on  file  Stress: Not on file  Social Connections: Not on file  Intimate Partner Violence: Not on file      Review of Systems  Constitutional:  Negative for chills, fatigue and unexpected weight change.  HENT:  Negative for congestion, postnasal drip, rhinorrhea, sneezing and sore throat.   Eyes:  Negative for redness.  Respiratory:  Negative for cough, chest tightness and shortness of breath.   Cardiovascular:  Negative for chest pain and palpitations.  Gastrointestinal:  Negative for abdominal pain, constipation, diarrhea, nausea and vomiting.  Genitourinary:  Negative for dysuria and frequency.  Musculoskeletal:  Negative for arthralgias, back pain, joint swelling and neck pain.  Skin:  Negative for rash.  Neurological: Negative.  Negative for tremors and numbness.  Hematological:  Negative for adenopathy. Does not bruise/bleed easily.  Psychiatric/Behavioral:  Negative for behavioral problems (Depression), sleep disturbance and suicidal ideas. The patient is not nervous/anxious.     Vital Signs: BP (!) 140/80   Pulse (!) 109   Temp 98.4 F (36.9 C)   Resp 16   Ht 5\' 11"  (1.803 m)   Wt 259 lb (117.5 kg)   SpO2 96%   BMI 36.12 kg/m    Physical Exam Vitals and nursing note reviewed.  Constitutional:      General: He is not in acute distress.    Appearance: Normal appearance. He is well-developed. He is obese. He is not diaphoretic.  HENT:     Head: Normocephalic and atraumatic.     Nose: Nose normal.     Mouth/Throat:     Pharynx: No oropharyngeal exudate.  Eyes:     Pupils: Pupils are equal, round, and reactive to light.  Neck:     Thyroid: No thyromegaly.     Vascular: No carotid bruit or JVD.     Trachea: No tracheal deviation.  Cardiovascular:     Rate and Rhythm: Regular rhythm. Tachycardia present.     Heart sounds: Normal heart sounds. No murmur heard.    No friction rub. No gallop.  Pulmonary:     Effort: Pulmonary effort is normal. No respiratory  distress.     Breath sounds: Normal breath sounds. No wheezing or rales.  Chest:     Chest wall: No tenderness.  Abdominal:     Palpations: Abdomen is soft.  Musculoskeletal:        General: Normal range of motion.     Cervical back: Normal range of motion and neck supple.  Lymphadenopathy:     Cervical: No cervical adenopathy.  Skin:    General: Skin is warm and dry.     Capillary Refill: Capillary refill takes less than 2 seconds.  Neurological:     Mental Status: He is alert and oriented to person, place, and time.     Cranial Nerves: No cranial nerve  deficit.  Psychiatric:        Mood and Affect: Mood normal.        Behavior: Behavior normal.        Thought Content: Thought content normal.        Judgment: Judgment normal.        Assessment/Plan: 1. Type 2 diabetes mellitus with hyperglycemia, without long-term current use of insulin (HCC) - POCT HgB A1C is 7.4 which is improved from 7.9 last visit, Will continue current medications and work on diet and exercise. - empagliflozin (JARDIANCE) 25 MG TABS tablet; TAKE 1 TABLET(25 MG) BY MOUTH DAILY BEFORE BREAKFAST  Dispense: 90 tablet; Refill: 1  2. Hypertension, unspecified type Will increase to 10mg  and will start monitoring at home/work. HR was also initially elevated and will monitor this as well--denies palpitations or any CP. Advised to call if any problems - lisinopril (ZESTRIL) 10 MG tablet; Take 1 tablet (10 mg total) by mouth daily.  Dispense: 90 tablet; Refill: 1  3. Mixed hyperlipidemia Continue crestor and vascepa and will check fasting labs   General Counseling: Macky verbalizes understanding of the findings of todays visit and agrees with plan of treatment. I have discussed any further diagnostic evaluation that may be needed or ordered today. We also reviewed his medications today. he has been encouraged to call the office with any questions or concerns that should arise related to todays visit.    Orders  Placed This Encounter  Procedures   POCT HgB A1C    Meds ordered this encounter  Medications   empagliflozin (JARDIANCE) 25 MG TABS tablet    Sig: TAKE 1 TABLET(25 MG) BY MOUTH DAILY BEFORE BREAKFAST    Dispense:  90 tablet    Refill:  1   DISCONTD: lisinopril (ZESTRIL) 5 MG tablet    Sig: Take 1 tablet (5 mg total) by mouth daily.    Dispense:  90 tablet    Refill:  1    Requesting 1 year supply   lisinopril (ZESTRIL) 10 MG tablet    Sig: Take 1 tablet (10 mg total) by mouth daily.    Dispense:  90 tablet    Refill:  1    This patient was seen by Lynn Ito, [name] in collaboration with Dr. Beverely Risen as a part of collaborative care agreement.   Total time spent:30 Minutes Time spent includes review of chart, medications, test results, and follow up plan with the patient.      Dr Lyndon Code Internal medicine

## 2022-12-09 ENCOUNTER — Other Ambulatory Visit: Payer: Self-pay | Admitting: Physician Assistant

## 2022-12-09 DIAGNOSIS — E1165 Type 2 diabetes mellitus with hyperglycemia: Secondary | ICD-10-CM

## 2022-12-09 DIAGNOSIS — I10 Essential (primary) hypertension: Secondary | ICD-10-CM

## 2023-02-07 ENCOUNTER — Other Ambulatory Visit: Payer: Self-pay | Admitting: Physician Assistant

## 2023-02-07 DIAGNOSIS — N529 Male erectile dysfunction, unspecified: Secondary | ICD-10-CM

## 2023-03-10 DIAGNOSIS — J3489 Other specified disorders of nose and nasal sinuses: Secondary | ICD-10-CM | POA: Diagnosis not present

## 2023-03-30 DIAGNOSIS — J029 Acute pharyngitis, unspecified: Secondary | ICD-10-CM | POA: Diagnosis not present

## 2023-04-07 ENCOUNTER — Other Ambulatory Visit: Payer: Self-pay

## 2023-04-07 DIAGNOSIS — Z125 Encounter for screening for malignant neoplasm of prostate: Secondary | ICD-10-CM

## 2023-04-08 ENCOUNTER — Other Ambulatory Visit: Payer: BC Managed Care – PPO | Admitting: *Deleted

## 2023-04-08 DIAGNOSIS — Z125 Encounter for screening for malignant neoplasm of prostate: Secondary | ICD-10-CM

## 2023-04-08 NOTE — Progress Notes (Signed)
Patient: Lawrence Yu           Date of Birth: 02/20/1971           MRN: [id number] Visit Date: 04/08/2023 PCP: Carlean Jews, [name]  Prostate Cancer Screening Date of last physical exam:  (October 2023) Date of last rectal exam:  (October 2023) Have you ever had any of the following?: None Have you ever had or been told you have an allergy to latex products?: No Are you currently taking any natural prostate preparations?: No Are you currently experiencing any urinary symptoms?: No  Prostate Exam Exam not completed. PSA Blood test completed only.  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

## 2023-04-09 LAB — PSA: Prostate Specific Ag, Serum: 0.6 ng/mL (ref 0.0–4.0)

## 2023-04-10 ENCOUNTER — Ambulatory Visit: Payer: BC Managed Care – PPO | Admitting: Physician Assistant

## 2023-04-13 ENCOUNTER — Other Ambulatory Visit: Payer: Self-pay | Admitting: Physician Assistant

## 2023-04-13 DIAGNOSIS — E1165 Type 2 diabetes mellitus with hyperglycemia: Secondary | ICD-10-CM

## 2023-05-03 ENCOUNTER — Other Ambulatory Visit: Payer: Self-pay | Admitting: Physician Assistant

## 2023-05-03 DIAGNOSIS — K219 Gastro-esophageal reflux disease without esophagitis: Secondary | ICD-10-CM

## 2023-05-22 ENCOUNTER — Encounter: Payer: Self-pay | Admitting: Physician Assistant

## 2023-05-22 ENCOUNTER — Ambulatory Visit: Payer: BC Managed Care – PPO | Admitting: Physician Assistant

## 2023-05-22 VITALS — BP 125/80 | HR 94 | Temp 98.3°F | Resp 16 | Ht 71.0 in | Wt 259.0 lb

## 2023-05-22 DIAGNOSIS — I10 Essential (primary) hypertension: Secondary | ICD-10-CM | POA: Diagnosis not present

## 2023-05-22 DIAGNOSIS — Z0001 Encounter for general adult medical examination with abnormal findings: Secondary | ICD-10-CM

## 2023-05-22 DIAGNOSIS — E1165 Type 2 diabetes mellitus with hyperglycemia: Secondary | ICD-10-CM

## 2023-05-22 DIAGNOSIS — E782 Mixed hyperlipidemia: Secondary | ICD-10-CM | POA: Diagnosis not present

## 2023-05-22 LAB — POCT GLYCOSYLATED HEMOGLOBIN (HGB A1C): Hemoglobin A1C: 7 % — AB (ref 4.0–5.6)

## 2023-05-22 MED ORDER — LISINOPRIL 10 MG PO TABS: 10.0000 mg | ORAL_TABLET | Freq: Every day | ORAL | 1 refills | Status: DC

## 2023-05-22 MED ORDER — JANUMET 50-1000 MG PO TABS: 1.0000 | ORAL_TABLET | Freq: Two times a day (BID) | ORAL | 1 refills | Status: DC

## 2023-05-22 MED ORDER — ICOSAPENT ETHYL 1 G PO CAPS: 2.0000 g | ORAL_CAPSULE | Freq: Two times a day (BID) | ORAL | 1 refills | Status: DC

## 2023-05-22 MED ORDER — ROSUVASTATIN CALCIUM 5 MG PO TABS: 5.0000 mg | ORAL_TABLET | Freq: Every day | ORAL | 3 refills | Status: DC

## 2023-05-22 MED ORDER — EMPAGLIFLOZIN 25 MG PO TABS: ORAL_TABLET | ORAL | 1 refills | Status: DC

## 2023-05-22 NOTE — Progress Notes (Unsigned)
[facility] [address] [place], [place] [address]  Internal MEDICINE  Office Visit Note  Patient Name: Lawrence Yu  [id number]  [id number]  Date of Service: 05/27/2023  Chief Complaint  Patient presents with   Diabetes   Annual Exam   Hypertension    HPI Pt is here for annual wellness visit -had blood work with dept physical and will have this sent over -BP stable -UTD on colonoscopy -Will check on tdap -foot exam done, left big toe-fungus and will start with topical -Eye exam coming up   Current Medication: Outpatient Encounter Medications as of 05/22/2023  Medication Sig   colchicine 0.6 MG tablet Take 2 tablets (1.2mg ) at start of flare and then 1 tablet 1 hour later.   LIFESCAN FINEPOINT LANCETS MISC Use to check blood sugar 2 time(s) daily.   Misc. Devices MISC by Does not apply route. cpap device   omeprazole (PRILOSEC) 20 MG capsule TAKE 1 CAPSULE(20 MG) BY MOUTH DAILY   sildenafil (REVATIO) 20 MG tablet TAKE 1 TABLET BY MOUTH DAILY AS NEEDED   [DISCONTINUED] empagliflozin (JARDIANCE) 25 MG TABS tablet TAKE 1 TABLET(25 MG) BY MOUTH DAILY BEFORE BREAKFAST   [DISCONTINUED] icosapent Ethyl (VASCEPA) 1 g capsule Take 2 capsules (2 g total) by mouth 2 (two) times daily.   [DISCONTINUED] JANUMET 50-1000 MG tablet TAKE 1 TABLET BY MOUTH TWICE DAILY WITH A MEAL   [DISCONTINUED] lisinopril (ZESTRIL) 10 MG tablet Take 1 tablet (10 mg total) by mouth daily.   [DISCONTINUED] rosuvastatin (CRESTOR) 5 MG tablet Take 1 tablet (5 mg total) by mouth daily.   empagliflozin (JARDIANCE) 25 MG TABS tablet TAKE 1 TABLET(25 MG) BY MOUTH DAILY BEFORE BREAKFAST   icosapent Ethyl (VASCEPA) 1 g capsule Take 2 capsules (2 g total) by mouth 2 (two) times daily.   lisinopril (ZESTRIL) 10 MG tablet Take 1 tablet (10 mg total) by mouth daily.   rosuvastatin (CRESTOR) 5 MG tablet Take 1 tablet (5 mg total) by mouth daily.   sitaGLIPtin-metformin (JANUMET) 50-1000 MG tablet Take 1  tablet by mouth 2 (two) times daily with a meal.   No facility-administered encounter medications on file as of 05/22/2023.    Surgical History: Past Surgical History:  Procedure Laterality Date   APPENDECTOMY     COLONOSCOPY WITH PROPOFOL N/A 05/29/2022   Procedure: COLONOSCOPY WITH PROPOFOL;  Surgeon: Wyline Mood, MD;  Location: [facility] ENDOSCOPY;  Service: Gastroenterology;  Laterality: N/A;    Medical History: Past Medical History:  Diagnosis Date   Diabetes mellitus without complication (HCC)    pre diabetic   GERD (gastroesophageal reflux disease)    Hypertension     Family History: Family History  Problem Relation Age of Onset   Diabetes Father    Kidney disease Father    Cancer Father    Dementia Maternal Grandfather    Dementia Paternal Grandmother     Social History   Socioeconomic History   Marital status: Married    Spouse name: Not on file   Number of children: Not on file   Years of education: Not on file   Highest education level: Not on file  Occupational History   Not on file  Tobacco Use   Smoking status: Never   Smokeless tobacco: Never  Substance and Sexual Activity   Alcohol use: No   Drug use: No   Sexual activity: Not on file  Other Topics Concern   Not on file  Social History Narrative   Not  on file   Social Determinants of Health   Financial Resource Strain: Not on file  Food Insecurity: Not on file  Transportation Needs: Not on file  Physical Activity: Not on file  Stress: Not on file  Social Connections: Not on file  Intimate Partner Violence: Not on file      Review of Systems  Constitutional:  Negative for chills, fatigue and unexpected weight change.  HENT:  Negative for congestion, postnasal drip, rhinorrhea, sneezing and sore throat.   Eyes:  Negative for redness.  Respiratory:  Negative for cough, chest tightness and shortness of breath.   Cardiovascular:  Negative for chest pain and palpitations.  Gastrointestinal:   Negative for abdominal pain, constipation, diarrhea, nausea and vomiting.  Genitourinary:  Negative for dysuria and frequency.  Musculoskeletal:  Negative for arthralgias, back pain, joint swelling and neck pain.  Skin:  Negative for rash.  Neurological: Negative.  Negative for tremors and numbness.  Hematological:  Negative for adenopathy. Does not bruise/bleed easily.  Psychiatric/Behavioral:  Negative for behavioral problems (Depression), sleep disturbance and suicidal ideas. The patient is not nervous/anxious.     Vital Signs: BP 125/80   Pulse 94   Temp 98.3 F (36.8 C)   Resp 16   Ht 5\' 11"  (1.803 m)   Wt 259 lb (117.5 kg)   SpO2 99%   BMI 36.12 kg/m    Physical Exam Vitals and nursing note reviewed.  Constitutional:      General: He is not in acute distress.    Appearance: Normal appearance. He is well-developed. He is obese. He is not diaphoretic.  HENT:     Head: Normocephalic and atraumatic.     Nose: Nose normal.     Mouth/Throat:     Pharynx: No oropharyngeal exudate.  Eyes:     Pupils: Pupils are equal, round, and reactive to light.  Neck:     Thyroid: No thyromegaly.     Vascular: No carotid bruit or JVD.     Trachea: No tracheal deviation.  Cardiovascular:     Rate and Rhythm: Normal rate and regular rhythm.     Pulses:          Posterior tibial pulses are 2+ on the right side and 2+ on the left side.     Heart sounds: Normal heart sounds. No murmur heard.    No friction rub. No gallop.  Pulmonary:     Effort: Pulmonary effort is normal. No respiratory distress.     Breath sounds: Normal breath sounds. No wheezing or rales.  Chest:     Chest wall: No tenderness.  Abdominal:     Palpations: Abdomen is soft.     Tenderness: There is no abdominal tenderness.  Musculoskeletal:        General: Normal range of motion.     Cervical back: Normal range of motion and neck supple.  Feet:     Right foot:     Protective Sensation: 2 sites tested.  2 sites  sensed.     Skin integrity: Skin integrity normal.     Toenail Condition: Right toenails are normal.     Left foot:     Protective Sensation: 2 sites tested.  2 sites sensed.     Skin integrity: Skin integrity normal.     Toenail Condition: Fungal disease present.    Comments: Toenail fungus on left big toe Lymphadenopathy:     Cervical: No cervical adenopathy.  Skin:    General: Skin is  warm and dry.     Capillary Refill: Capillary refill takes less than 2 seconds.  Neurological:     Mental Status: He is alert and oriented to person, place, and time.     Cranial Nerves: No cranial nerve deficit.  Psychiatric:        Mood and Affect: Mood normal.        Behavior: Behavior normal.        Thought Content: Thought content normal.        Judgment: Judgment normal.        Assessment/Plan: 1. Encounter for general adult medical examination with abnormal findings CPE performed, will look up tdap date, otherwise UTD on PHM  2. Type 2 diabetes mellitus with hyperglycemia, without long-term current use of insulin (HCC) - POCT HgB A1C is 7.0 which is improved from 7.4 last visit and is the best it has been in years. Applauded on this and will continue with current medications and working on diet and exercise - sitaGLIPtin-metformin (JANUMET) 50-1000 MG tablet; Take 1 tablet by mouth 2 (two) times daily with a meal.  Dispense: 180 tablet; Refill: 1 - empagliflozin (JARDIANCE) 25 MG TABS tablet; TAKE 1 TABLET(25 MG) BY MOUTH DAILY BEFORE BREAKFAST  Dispense: 90 tablet; Refill: 1 - Urine Microalbumin w/creat. ratio  3. Hypertension, unspecified type Stable, continue current medicaiton - lisinopril (ZESTRIL) 10 MG tablet; Take 1 tablet (10 mg total) by mouth daily.  Dispense: 90 tablet; Refill: 1  4. Mixed hyperlipidemia - icosapent Ethyl (VASCEPA) 1 g capsule; Take 2 capsules (2 g total) by mouth 2 (two) times daily.  Dispense: 360 capsule; Refill: 1 - rosuvastatin (CRESTOR) 5 MG tablet;  Take 1 tablet (5 mg total) by mouth daily.  Dispense: 90 tablet; Refill: 3   General Counseling: Alec verbalizes understanding of the findings of todays visit and agrees with plan of treatment. I have discussed any further diagnostic evaluation that may be needed or ordered today. We also reviewed his medications today. he has been encouraged to call the office with any questions or concerns that should arise related to todays visit.    Orders Placed This Encounter  Procedures   Urine Microalbumin w/creat. ratio   POCT HgB A1C    Meds ordered this encounter  Medications   sitaGLIPtin-metformin (JANUMET) 50-1000 MG tablet    Sig: Take 1 tablet by mouth 2 (two) times daily with a meal.    Dispense:  180 tablet    Refill:  1   empagliflozin (JARDIANCE) 25 MG TABS tablet    Sig: TAKE 1 TABLET(25 MG) BY MOUTH DAILY BEFORE BREAKFAST    Dispense:  90 tablet    Refill:  1   lisinopril (ZESTRIL) 10 MG tablet    Sig: Take 1 tablet (10 mg total) by mouth daily.    Dispense:  90 tablet    Refill:  1   icosapent Ethyl (VASCEPA) 1 g capsule    Sig: Take 2 capsules (2 g total) by mouth 2 (two) times daily.    Dispense:  360 capsule    Refill:  1    Requesting 1 year supply   rosuvastatin (CRESTOR) 5 MG tablet    Sig: Take 1 tablet (5 mg total) by mouth daily.    Dispense:  90 tablet    Refill:  3    This patient was seen by Lynn Ito, [name] in collaboration with Dr. Beverely Risen as a part of collaborative care agreement.   Total time spent:30  Minutes Time spent includes review of chart, medications, test results, and follow up plan with the patient.      Dr Lyndon Code Internal medicine

## 2023-05-24 LAB — MICROALBUMIN / CREATININE URINE RATIO
Creatinine, Urine: 43 mg/dL
Microalb/Creat Ratio: 9 mg/g{creat} (ref 0–29)
Microalbumin, Urine: 3.7 ug/mL

## 2023-05-27 ENCOUNTER — Telehealth: Payer: Self-pay

## 2023-05-27 NOTE — Telephone Encounter (Signed)
Lmom to pt as per lauren to let him know that I would like to increase his crestor since his cholesterol was still very high on the labs he sent over. he can double his 5mg  to take 10mg  nightly or send new script for 10mg  if about due for refill

## 2023-09-18 ENCOUNTER — Ambulatory Visit: Payer: BC Managed Care – PPO | Admitting: Physician Assistant

## 2023-11-12 ENCOUNTER — Other Ambulatory Visit: Payer: Self-pay | Admitting: Physician Assistant

## 2023-11-12 DIAGNOSIS — E1165 Type 2 diabetes mellitus with hyperglycemia: Secondary | ICD-10-CM

## 2023-12-16 ENCOUNTER — Other Ambulatory Visit: Payer: Self-pay | Admitting: Physician Assistant

## 2023-12-16 DIAGNOSIS — N529 Male erectile dysfunction, unspecified: Secondary | ICD-10-CM

## 2024-02-10 ENCOUNTER — Other Ambulatory Visit: Payer: Self-pay | Admitting: Physician Assistant

## 2024-02-10 DIAGNOSIS — I10 Essential (primary) hypertension: Secondary | ICD-10-CM

## 2024-03-02 ENCOUNTER — Telehealth: Payer: Self-pay

## 2024-03-02 NOTE — Telephone Encounter (Signed)
 Lmom that pt missed his appt in feb and no next please call office and make appt

## 2024-03-07 ENCOUNTER — Encounter: Payer: Self-pay | Admitting: Physician Assistant

## 2024-03-07 ENCOUNTER — Ambulatory Visit: Admitting: Physician Assistant

## 2024-03-07 ENCOUNTER — Telehealth: Payer: Self-pay | Admitting: Physician Assistant

## 2024-03-07 VITALS — BP 121/73 | HR 93 | Temp 98.0°F | Resp 16 | Ht 71.0 in | Wt 257.0 lb

## 2024-03-07 DIAGNOSIS — E1165 Type 2 diabetes mellitus with hyperglycemia: Secondary | ICD-10-CM

## 2024-03-07 DIAGNOSIS — E349 Endocrine disorder, unspecified: Secondary | ICD-10-CM

## 2024-03-07 DIAGNOSIS — K219 Gastro-esophageal reflux disease without esophagitis: Secondary | ICD-10-CM

## 2024-03-07 DIAGNOSIS — I10 Essential (primary) hypertension: Secondary | ICD-10-CM

## 2024-03-07 DIAGNOSIS — Z1329 Encounter for screening for other suspected endocrine disorder: Secondary | ICD-10-CM

## 2024-03-07 DIAGNOSIS — G4733 Obstructive sleep apnea (adult) (pediatric): Secondary | ICD-10-CM

## 2024-03-07 DIAGNOSIS — E782 Mixed hyperlipidemia: Secondary | ICD-10-CM

## 2024-03-07 DIAGNOSIS — N529 Male erectile dysfunction, unspecified: Secondary | ICD-10-CM

## 2024-03-07 LAB — POCT GLYCOSYLATED HEMOGLOBIN (HGB A1C): Hemoglobin A1C: 7.2 % — AB (ref 4.0–5.6)

## 2024-03-07 MED ORDER — EMPAGLIFLOZIN 25 MG PO TABS: 25.0000 mg | ORAL_TABLET | Freq: Every day | ORAL | 1 refills | Status: DC

## 2024-03-07 MED ORDER — JANUMET 50-1000 MG PO TABS: 1.0000 | ORAL_TABLET | Freq: Two times a day (BID) | ORAL | 1 refills | Status: AC

## 2024-03-07 MED ORDER — ROSUVASTATIN CALCIUM 5 MG PO TABS: 5.0000 mg | ORAL_TABLET | Freq: Every day | ORAL | 3 refills | Status: AC

## 2024-03-07 MED ORDER — ICOSAPENT ETHYL 1 G PO CAPS: 2.0000 g | ORAL_CAPSULE | Freq: Two times a day (BID) | ORAL | 1 refills | Status: AC

## 2024-03-07 MED ORDER — OMEPRAZOLE 20 MG PO CPDR: 20.0000 mg | DELAYED_RELEASE_CAPSULE | Freq: Every day | ORAL | 3 refills | Status: AC

## 2024-03-07 NOTE — Telephone Encounter (Signed)
 Notified Beth & Sarah w/ [facility] of cpap replacement and supplies-Toni

## 2024-03-07 NOTE — Progress Notes (Signed)
 [facility] [address] [place], [place] [address]  Internal MEDICINE  Office Visit Note  Patient Name: Lawrence Yu  [id number]  [id number]  Date of Service: 03/07/2024  Chief Complaint  Patient presents with   Follow-up   Diabetes   Gastroesophageal Reflux   Medication Refill   Quality Metric Gaps    Needs diabetic eye exam, TDAP    HPI Pt is here for routine follow up -Bp well controlled -BG not checked often -taking his medications regularly -he is going through a separation currently, but reports doing ok -blister on bottom of left foot, started to hurt some and checked it and found hole in shoes and developed blister while walking all day on a conference floor. Small one on right foot as well. Happened a little over a week ago -healing well, no signs of infection. Was doing neosporin and wrapping it. -unsure when last tdap and will get this updated. -needs diabetic eye exam, will schedule -does have OSA, CPAP making noises and is 5 years ago. Uses [facility].  Using nightly and benefits from PAP and due for replacement.  -having some trouble maintaining erection. Does not like to take meds and not requesting refill at this time, but would like to check on some underlying causes like testosterone  level. Would be interested in seeing urology to address further, but open to check initial labs first. He has his PSA and other labs coming up with his [employer] physical next month  Current Medication: Outpatient Encounter Medications as of 03/07/2024  Medication Sig   colchicine  0.6 MG tablet Take 2 tablets (1.2mg ) at start of flare and then 1 tablet 1 hour later.   LIFESCAN FINEPOINT LANCETS MISC Use to check blood sugar 2 time(s) daily.   lisinopril  (ZESTRIL ) 10 MG tablet TAKE 1 TABLET(10 MG) BY MOUTH DAILY   Misc. Devices MISC by Does not apply route. cpap device   sildenafil  (REVATIO ) 20 MG tablet TAKE 1 TABLET BY MOUTH DAILY AS NEEDED   [DISCONTINUED]  empagliflozin  (JARDIANCE ) 25 MG TABS tablet TAKE 1 TABLET(25 MG) BY MOUTH DAILY BEFORE BREAKFAST   [DISCONTINUED] icosapent  Ethyl (VASCEPA ) 1 g capsule Take 2 capsules (2 g total) by mouth 2 (two) times daily.   [DISCONTINUED] omeprazole  (PRILOSEC) 20 MG capsule TAKE 1 CAPSULE(20 MG) BY MOUTH DAILY   [DISCONTINUED] rosuvastatin  (CRESTOR ) 5 MG tablet Take 1 tablet (5 mg total) by mouth daily.   [DISCONTINUED] sitaGLIPtin -metformin  (JANUMET ) 50-1000 MG tablet Take 1 tablet by mouth 2 (two) times daily with a meal.   empagliflozin  (JARDIANCE ) 25 MG TABS tablet Take 1 tablet (25 mg total) by mouth daily.   icosapent  Ethyl (VASCEPA ) 1 g capsule Take 2 capsules (2 g total) by mouth 2 (two) times daily.   omeprazole  (PRILOSEC) 20 MG capsule Take 1 capsule (20 mg total) by mouth daily.   rosuvastatin  (CRESTOR ) 5 MG tablet Take 1 tablet (5 mg total) by mouth daily.   sitaGLIPtin -metformin  (JANUMET ) 50-1000 MG tablet Take 1 tablet by mouth 2 (two) times daily with a meal.   No facility-administered encounter medications on file as of 03/07/2024.    Surgical History: Past Surgical History:  Procedure Laterality Date   APPENDECTOMY     COLONOSCOPY WITH PROPOFOL  N/A 05/29/2022   Procedure: COLONOSCOPY WITH PROPOFOL ;  Surgeon: Therisa Bi, MD;  Location: [facility] ENDOSCOPY;  Service: Gastroenterology;  Laterality: N/A;    Medical History: Past Medical History:  Diagnosis Date   Diabetes mellitus without complication (HCC)  pre diabetic   GERD (gastroesophageal reflux disease)    Hypertension     Family History: Family History  Problem Relation Age of Onset   Diabetes Father    Kidney disease Father    Cancer Father    Dementia Maternal Grandfather    Dementia Paternal Grandmother     Social History   Socioeconomic History   Marital status: Married    Spouse name: Not on file   Number of children: Not on file   Years of education: Not on file   Highest education level: Not on file   Occupational History   Not on file  Tobacco Use   Smoking status: Never   Smokeless tobacco: Never  Substance and Sexual Activity   Alcohol use: No   Drug use: No   Sexual activity: Not on file  Other Topics Concern   Not on file  Social History Narrative   Not on file   Social Drivers of Health   Financial Resource Strain: Not on file  Food Insecurity: Not on file  Transportation Needs: Not on file  Physical Activity: Not on file  Stress: Not on file  Social Connections: Not on file  Intimate Partner Violence: Not on file      Review of Systems  Constitutional:  Negative for chills, fatigue and unexpected weight change.  HENT:  Negative for congestion, postnasal drip, rhinorrhea, sneezing and sore throat.   Eyes:  Negative for redness.  Respiratory:  Negative for cough, chest tightness and shortness of breath.   Cardiovascular:  Negative for chest pain and palpitations.  Gastrointestinal:  Negative for abdominal pain, constipation, diarrhea, nausea and vomiting.  Genitourinary:  Negative for dysuria and frequency.       ED  Musculoskeletal:  Negative for arthralgias, back pain, joint swelling and neck pain.  Skin:  Negative for rash.       Healing blisters on bottom of both feet  Neurological: Negative.  Negative for tremors and numbness.  Hematological:  Negative for adenopathy. Does not bruise/bleed easily.  Psychiatric/Behavioral:  Negative for behavioral problems (Depression), sleep disturbance and suicidal ideas. The patient is not nervous/anxious.     Vital Signs: BP 121/73   Pulse 93   Temp 98 F (36.7 C)   Resp 16   Ht 5' 11 (1.803 m)   Wt 257 lb (116.6 kg)   SpO2 98%   BMI 35.84 kg/m    Physical Exam Vitals and nursing note reviewed.  Constitutional:      General: He is not in acute distress.    Appearance: Normal appearance. He is well-developed. He is obese. He is not diaphoretic.  HENT:     Head: Normocephalic and atraumatic.     Nose:  Nose normal.     Mouth/Throat:     Pharynx: No oropharyngeal exudate.  Eyes:     Pupils: Pupils are equal, round, and reactive to light.  Neck:     Thyroid: No thyromegaly.     Vascular: No carotid bruit or JVD.     Trachea: No tracheal deviation.  Cardiovascular:     Rate and Rhythm: Normal rate and regular rhythm.     Heart sounds: Normal heart sounds. No murmur heard.    No friction rub. No gallop.  Pulmonary:     Effort: Pulmonary effort is normal. No respiratory distress.     Breath sounds: Normal breath sounds. No wheezing or rales.  Chest:     Chest wall: No tenderness.  Musculoskeletal:        General: Normal range of motion.     Cervical back: Normal range of motion and neck supple.  Lymphadenopathy:     Cervical: No cervical adenopathy.  Skin:    General: Skin is warm and dry.     Capillary Refill: Capillary refill takes less than 2 seconds.     Comments: Large area of new skin formation on left and right plantar surfaces of feet. No erythema, drainage, or open wound  Neurological:     Mental Status: He is alert and oriented to person, place, and time.  Psychiatric:        Mood and Affect: Mood normal.        Behavior: Behavior normal.        Thought Content: Thought content normal.        Judgment: Judgment normal.        Assessment/Plan: 1. Type 2 diabetes mellitus with hyperglycemia, without long-term current use of insulin (HCC) (Primary) - POCT HgB A1C is 7.2 which is up from 7.0 last check and will work on improving diet and exercise. Also discussed well fitting supportive shoes to prevent blisters/ulcers - empagliflozin  (JARDIANCE ) 25 MG TABS tablet; Take 1 tablet (25 mg total) by mouth daily.  Dispense: 90 tablet; Refill: 1 - sitaGLIPtin -metformin  (JANUMET ) 50-1000 MG tablet; Take 1 tablet by mouth 2 (two) times daily with a meal.  Dispense: 180 tablet; Refill: 1  2. Hypertension, unspecified type Stable, continue current medication  3. 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  Dispense: 360 capsule; Refill: 1 - rosuvastatin  (CRESTOR ) 5 MG tablet; Take 1 tablet (5 mg total) by mouth daily.  Dispense: 90 tablet; Refill: 3  4. OSA on CPAP Benefiting from PAP use, but current machine is past end of life and due to be replaced - For home use only DME continuous positive airway pressure (CPAP)  5. Gastroesophageal reflux disease without esophagitis - omeprazole  (PRILOSEC) 20 MG capsule; Take 1 capsule (20 mg total) by mouth daily.  Dispense: 90 capsule; Refill: 3  6. Thyroid disorder screening - TSH + free T4  7. Testosterone  deficiency - Testosterone ,Free and Total - Prolactin  8. Erectile dysfunction, unspecified erectile dysfunction type Declines ED meds at this time, but will check labs. Pt interested in urology referral in future - Testosterone ,Free and Total - Prolactin   General Counseling: Shariff verbalizes understanding of the findings of todays visit and agrees with plan of treatment. I have discussed any further diagnostic evaluation that may be needed or ordered today. We also reviewed his medications today. he has been encouraged to call the office with any questions or concerns that should arise related to todays visit.    Orders Placed This Encounter  Procedures   For home use only DME continuous positive airway pressure (CPAP)   Testosterone ,Free and Total   Prolactin   TSH + free T4   POCT HgB A1C    Meds ordered this encounter  Medications   empagliflozin  (JARDIANCE ) 25 MG TABS tablet    Sig: Take 1 tablet (25 mg total) by mouth daily.    Dispense:  90 tablet    Refill:  1    Pt need appt for further refills   icosapent  Ethyl (VASCEPA ) 1 g capsule    Sig: Take 2 capsules (2 g total)  by mouth 2 (two) times daily.    Dispense:  360 capsule     Refill:  1    Requesting 1 year supply   omeprazole  (PRILOSEC) 20 MG capsule    Sig: Take 1 capsule (20 mg total) by mouth daily.    Dispense:  90 capsule    Refill:  3   rosuvastatin  (CRESTOR ) 5 MG tablet    Sig: Take 1 tablet (5 mg total) by mouth daily.    Dispense:  90 tablet    Refill:  3   sitaGLIPtin -metformin  (JANUMET ) 50-1000 MG tablet    Sig: Take 1 tablet by mouth 2 (two) times daily with a meal.    Dispense:  180 tablet    Refill:  1    This patient was seen by Tinnie Pro, [name] in collaboration with Dr. Sigrid Bathe as a part of collaborative care agreement.   Total time spent:35 Minutes Time spent includes review of chart, medications, test results, and follow up plan with the patient.      Dr Fozia M Khan Internal medicine

## 2024-03-30 DIAGNOSIS — M9903 Segmental and somatic dysfunction of lumbar region: Secondary | ICD-10-CM | POA: Diagnosis not present

## 2024-03-30 DIAGNOSIS — M9904 Segmental and somatic dysfunction of sacral region: Secondary | ICD-10-CM | POA: Diagnosis not present

## 2024-03-30 DIAGNOSIS — M9905 Segmental and somatic dysfunction of pelvic region: Secondary | ICD-10-CM | POA: Diagnosis not present

## 2024-03-30 DIAGNOSIS — M9901 Segmental and somatic dysfunction of cervical region: Secondary | ICD-10-CM | POA: Diagnosis not present

## 2024-03-30 DIAGNOSIS — M9902 Segmental and somatic dysfunction of thoracic region: Secondary | ICD-10-CM | POA: Diagnosis not present

## 2024-04-04 DIAGNOSIS — M9904 Segmental and somatic dysfunction of sacral region: Secondary | ICD-10-CM | POA: Diagnosis not present

## 2024-04-04 DIAGNOSIS — M9902 Segmental and somatic dysfunction of thoracic region: Secondary | ICD-10-CM | POA: Diagnosis not present

## 2024-04-04 DIAGNOSIS — M9903 Segmental and somatic dysfunction of lumbar region: Secondary | ICD-10-CM | POA: Diagnosis not present

## 2024-04-04 DIAGNOSIS — M9901 Segmental and somatic dysfunction of cervical region: Secondary | ICD-10-CM | POA: Diagnosis not present

## 2024-04-06 DIAGNOSIS — M9904 Segmental and somatic dysfunction of sacral region: Secondary | ICD-10-CM | POA: Diagnosis not present

## 2024-04-06 DIAGNOSIS — M9903 Segmental and somatic dysfunction of lumbar region: Secondary | ICD-10-CM | POA: Diagnosis not present

## 2024-04-06 DIAGNOSIS — M9902 Segmental and somatic dysfunction of thoracic region: Secondary | ICD-10-CM | POA: Diagnosis not present

## 2024-04-06 DIAGNOSIS — M9901 Segmental and somatic dysfunction of cervical region: Secondary | ICD-10-CM | POA: Diagnosis not present

## 2024-04-13 DIAGNOSIS — M9901 Segmental and somatic dysfunction of cervical region: Secondary | ICD-10-CM | POA: Diagnosis not present

## 2024-04-13 DIAGNOSIS — M9903 Segmental and somatic dysfunction of lumbar region: Secondary | ICD-10-CM | POA: Diagnosis not present

## 2024-04-13 DIAGNOSIS — M9904 Segmental and somatic dysfunction of sacral region: Secondary | ICD-10-CM | POA: Diagnosis not present

## 2024-04-13 DIAGNOSIS — M9902 Segmental and somatic dysfunction of thoracic region: Secondary | ICD-10-CM | POA: Diagnosis not present

## 2024-04-26 ENCOUNTER — Telehealth: Payer: Self-pay | Admitting: Physician Assistant

## 2024-04-26 NOTE — Telephone Encounter (Signed)
 Per Beth with [facility], patient was set up on cpap replacement 04/14/24-Toni

## 2024-04-27 DIAGNOSIS — M9904 Segmental and somatic dysfunction of sacral region: Secondary | ICD-10-CM | POA: Diagnosis not present

## 2024-04-27 DIAGNOSIS — M9901 Segmental and somatic dysfunction of cervical region: Secondary | ICD-10-CM | POA: Diagnosis not present

## 2024-04-27 DIAGNOSIS — E349 Endocrine disorder, unspecified: Secondary | ICD-10-CM | POA: Diagnosis not present

## 2024-04-27 DIAGNOSIS — M9902 Segmental and somatic dysfunction of thoracic region: Secondary | ICD-10-CM | POA: Diagnosis not present

## 2024-04-27 DIAGNOSIS — M9903 Segmental and somatic dysfunction of lumbar region: Secondary | ICD-10-CM | POA: Diagnosis not present

## 2024-04-27 DIAGNOSIS — N529 Male erectile dysfunction, unspecified: Secondary | ICD-10-CM | POA: Diagnosis not present

## 2024-04-27 DIAGNOSIS — Z1329 Encounter for screening for other suspected endocrine disorder: Secondary | ICD-10-CM | POA: Diagnosis not present

## 2024-04-28 LAB — TSH+FREE T4
Free T4: 1.33 ng/dL (ref 0.82–1.77)
TSH: 1.02 u[IU]/mL (ref 0.450–4.500)

## 2024-04-28 LAB — TESTOSTERONE,FREE AND TOTAL
Testosterone, Free: 12.2 pg/mL (ref 7.2–24.0)
Testosterone: 303 ng/dL (ref 264–916)

## 2024-04-28 LAB — PROLACTIN: Prolactin: 4.4 ng/mL (ref 3.6–25.2)

## 2024-05-11 ENCOUNTER — Ambulatory Visit: Payer: Self-pay | Admitting: Physician Assistant

## 2024-05-11 NOTE — Telephone Encounter (Signed)
 LVM regarding labs being normal.

## 2024-05-12 DIAGNOSIS — M9904 Segmental and somatic dysfunction of sacral region: Secondary | ICD-10-CM | POA: Diagnosis not present

## 2024-05-12 DIAGNOSIS — M9903 Segmental and somatic dysfunction of lumbar region: Secondary | ICD-10-CM | POA: Diagnosis not present

## 2024-05-12 DIAGNOSIS — M9901 Segmental and somatic dysfunction of cervical region: Secondary | ICD-10-CM | POA: Diagnosis not present

## 2024-05-12 DIAGNOSIS — M9902 Segmental and somatic dysfunction of thoracic region: Secondary | ICD-10-CM | POA: Diagnosis not present

## 2024-05-20 ENCOUNTER — Ambulatory Visit: Payer: Self-pay | Admitting: Podiatry

## 2024-05-20 DIAGNOSIS — L603 Nail dystrophy: Secondary | ICD-10-CM | POA: Diagnosis not present

## 2024-05-20 NOTE — Progress Notes (Signed)
 Subjective:  Patient ID: Lawrence Yu, male    DOB: 10/18/1970,  MRN: [id number]  Chief Complaint  Patient presents with   Nail Problem    Left hallux nail     53 y.o. male presents with the above complaint.  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  He states that been causing him a lot of discomfort has been very loose as well.  He wants to get it removed he would like to make a permanent has not seen and was prior to see me pain scale was 5 out of 10.  Dull aching nature.   Review of Systems: Negative except as noted in the HPI. Denies N/V/F/Ch.  Past Medical History:  Diagnosis Date   Diabetes mellitus without complication (HCC)    pre diabetic   GERD (gastroesophageal reflux disease)    Hypertension     Current Outpatient Medications:    cyclobenzaprine (FLEXERIL) 5 MG tablet, Take ONE tab PO BID/TID PRN, Disp: , Rfl:    metaxalone (SKELAXIN) 800 MG tablet, Take 800 mg by mouth 3 (three) times daily., Disp: , Rfl:    omega-3 acid ethyl esters (LOVAZA) 1 g capsule, Take by mouth., Disp: , Rfl:    colchicine  0.6 MG tablet, Take 2 tablets (1.2mg ) at start of flare and then 1 tablet 1 hour later., Disp: 12 tablet, Rfl: 1   empagliflozin  (JARDIANCE ) 25 MG TABS tablet, Take 1 tablet (25 mg total) by mouth daily., Disp: 90 tablet, Rfl: 1   icosapent  Ethyl (VASCEPA ) 1 g capsule, Take 2 capsules (2 g total) by mouth 2 (two) times daily., Disp: 360 capsule, Rfl: 1   LIFESCAN FINEPOINT LANCETS MISC, Use to check blood sugar 2 time(s) daily., Disp: , Rfl:    lisinopril  (ZESTRIL ) 10 MG tablet, TAKE 1 TABLET(10 MG) BY MOUTH DAILY, Disp: 90 tablet, Rfl: 1   Misc. Devices MISC, by Does not apply route. cpap device, Disp: , Rfl:    omeprazole  (PRILOSEC) 20 MG capsule, Take 1 capsule (20 mg total) by mouth daily., Disp: 90 capsule, Rfl: 3   Omeprazole  Magnesium (PRILOSEC PO), , Disp: , Rfl:    rosuvastatin  (CRESTOR ) 5 MG tablet, Take 1  tablet (5 mg total) by mouth daily., Disp: 90 tablet, Rfl: 3   sildenafil  (REVATIO ) 20 MG tablet, TAKE 1 TABLET BY MOUTH DAILY AS NEEDED, Disp: 30 tablet, Rfl: 0   sitaGLIPtin -metformin  (JANUMET ) 50-1000 MG tablet, Take 1 tablet by mouth 2 (two) times daily with a meal., Disp: 180 tablet, Rfl: 1  Social History   Tobacco Use  Smoking Status Never  Smokeless Tobacco Never    No Known Allergies Objective:  There were no vitals filed for this visit. There is no height or weight on file to calculate BMI. Constitutional Well developed. Well nourished.  Vascular Dorsalis pedis pulses palpable bilaterally. Posterior tibial pulses palpable bilaterally. Capillary refill normal to all digits.  No cyanosis or clubbing noted. Pedal hair growth normal.  Neurologic Normal speech. Oriented to person, place, and time. Epicritic sensation to light touch grossly present bilaterally.  Dermatologic Pain on palpation of the entire/total nail on 1st digit of the left No other open wounds. No skin lesions.  Orthopedic: Normal joint ROM without pain or crepitus bilaterally. No visible deformities. No bony tenderness.   Radiographs: None Assessment:   1. Nail dystrophy    Plan:  Patient was evaluated and treated and all questions answered.  Nail contusion/dystrophy hallux, left -  Patient elects to proceed with minor surgery to remove entire toenail today. Consent reviewed and signed by patient. -Entire/total nail excised. See procedure note. -Educated on post-procedure care including soaking. Written instructions provided and reviewed. -Patient to follow up in 2 weeks for nail check.  Procedure: Excision of entire/total nail with phenol matricectomy Location: Left 1st toe digit Anesthesia: Lidocaine  1% plain; 1.5 mL and Marcaine 0.5% plain; 1.5 mL, digital block. Skin Prep: Betadine. Dressing: Silvadene; telfa; dry, sterile, compression dressing. Technique: Following skin prep, the toe was  exsanguinated and a tourniquet was secured at the base of the toe. The affected nail border was freed and excised.  Phenol matricectomy was performed in standard technique the tourniquet was then removed and sterile dressing applied. Disposition: Patient tolerated procedure well. Patient to return in 2 weeks for follow-up.   No follow-ups on file.

## 2024-05-20 NOTE — Patient Instructions (Signed)

## 2024-05-25 DIAGNOSIS — M9902 Segmental and somatic dysfunction of thoracic region: Secondary | ICD-10-CM | POA: Diagnosis not present

## 2024-05-25 DIAGNOSIS — M9901 Segmental and somatic dysfunction of cervical region: Secondary | ICD-10-CM | POA: Diagnosis not present

## 2024-05-25 DIAGNOSIS — M9904 Segmental and somatic dysfunction of sacral region: Secondary | ICD-10-CM | POA: Diagnosis not present

## 2024-05-25 DIAGNOSIS — M9903 Segmental and somatic dysfunction of lumbar region: Secondary | ICD-10-CM | POA: Diagnosis not present

## 2024-06-08 DIAGNOSIS — M9904 Segmental and somatic dysfunction of sacral region: Secondary | ICD-10-CM | POA: Diagnosis not present

## 2024-06-08 DIAGNOSIS — M9903 Segmental and somatic dysfunction of lumbar region: Secondary | ICD-10-CM | POA: Diagnosis not present

## 2024-06-08 DIAGNOSIS — M9902 Segmental and somatic dysfunction of thoracic region: Secondary | ICD-10-CM | POA: Diagnosis not present

## 2024-06-08 DIAGNOSIS — M9901 Segmental and somatic dysfunction of cervical region: Secondary | ICD-10-CM | POA: Diagnosis not present

## 2024-06-14 DIAGNOSIS — G4733 Obstructive sleep apnea (adult) (pediatric): Secondary | ICD-10-CM | POA: Diagnosis not present

## 2024-06-22 DIAGNOSIS — M9901 Segmental and somatic dysfunction of cervical region: Secondary | ICD-10-CM | POA: Diagnosis not present

## 2024-06-22 DIAGNOSIS — M9902 Segmental and somatic dysfunction of thoracic region: Secondary | ICD-10-CM | POA: Diagnosis not present

## 2024-06-22 DIAGNOSIS — M9903 Segmental and somatic dysfunction of lumbar region: Secondary | ICD-10-CM | POA: Diagnosis not present

## 2024-06-22 DIAGNOSIS — M9904 Segmental and somatic dysfunction of sacral region: Secondary | ICD-10-CM | POA: Diagnosis not present

## 2024-07-04 ENCOUNTER — Ambulatory Visit: Admitting: Physician Assistant

## 2024-07-05 DIAGNOSIS — M9901 Segmental and somatic dysfunction of cervical region: Secondary | ICD-10-CM | POA: Diagnosis not present

## 2024-07-05 DIAGNOSIS — M9903 Segmental and somatic dysfunction of lumbar region: Secondary | ICD-10-CM | POA: Diagnosis not present

## 2024-07-05 DIAGNOSIS — M9904 Segmental and somatic dysfunction of sacral region: Secondary | ICD-10-CM | POA: Diagnosis not present

## 2024-07-05 DIAGNOSIS — M9902 Segmental and somatic dysfunction of thoracic region: Secondary | ICD-10-CM | POA: Diagnosis not present

## 2024-08-08 ENCOUNTER — Other Ambulatory Visit: Payer: Self-pay | Admitting: Physician Assistant

## 2024-08-08 DIAGNOSIS — E1165 Type 2 diabetes mellitus with hyperglycemia: Secondary | ICD-10-CM
